# Patient Record
Sex: Male | Born: 1980 | Race: White | Hispanic: No | Marital: Single | Smoking: Former smoker
Health system: Southern US, Community
[De-identification: ages and names within clinical notes are randomized; demographics above are authoritative.]

## PROBLEM LIST (undated history)

## (undated) DIAGNOSIS — F419 Anxiety disorder, unspecified: Secondary | ICD-10-CM

## (undated) DIAGNOSIS — K219 Gastro-esophageal reflux disease without esophagitis: Secondary | ICD-10-CM

## (undated) DIAGNOSIS — T7840XA Allergy, unspecified, initial encounter: Secondary | ICD-10-CM

## (undated) DIAGNOSIS — M1990 Unspecified osteoarthritis, unspecified site: Secondary | ICD-10-CM

## (undated) DIAGNOSIS — E7800 Pure hypercholesterolemia, unspecified: Secondary | ICD-10-CM

## (undated) HISTORY — DX: Gastro-esophageal reflux disease without esophagitis: K21.9

## (undated) HISTORY — DX: Allergy, unspecified, initial encounter: T78.40XA

## (undated) HISTORY — PX: WRIST SURGERY: SHX841

## (undated) HISTORY — PX: ARTERIAL THROMBECTOMY: SHX558

## (undated) HISTORY — DX: Unspecified osteoarthritis, unspecified site: M19.90

## (undated) HISTORY — PX: WISDOM TOOTH EXTRACTION: SHX21

## (undated) HISTORY — DX: Anxiety disorder, unspecified: F41.9

---

## 1999-11-08 ENCOUNTER — Emergency Department (HOSPITAL_COMMUNITY): Admission: EM | Admit: 1999-11-08 | Discharge: 1999-11-08 | Payer: Self-pay | Admitting: Emergency Medicine

## 2004-08-02 ENCOUNTER — Emergency Department (HOSPITAL_COMMUNITY): Admission: EM | Admit: 2004-08-02 | Discharge: 2004-08-02 | Payer: Self-pay | Admitting: *Deleted

## 2009-04-10 ENCOUNTER — Encounter: Admission: RE | Admit: 2009-04-10 | Discharge: 2009-04-10 | Payer: Self-pay | Admitting: Family Medicine

## 2009-09-23 ENCOUNTER — Encounter: Admission: RE | Admit: 2009-09-23 | Discharge: 2009-09-23 | Payer: Self-pay | Admitting: Family Medicine

## 2009-09-23 ENCOUNTER — Telehealth (INDEPENDENT_AMBULATORY_CARE_PROVIDER_SITE_OTHER): Payer: Self-pay | Admitting: *Deleted

## 2009-09-24 ENCOUNTER — Ambulatory Visit: Payer: Self-pay | Admitting: Internal Medicine

## 2009-09-24 DIAGNOSIS — K648 Other hemorrhoids: Secondary | ICD-10-CM | POA: Insufficient documentation

## 2009-09-24 DIAGNOSIS — K219 Gastro-esophageal reflux disease without esophagitis: Secondary | ICD-10-CM | POA: Insufficient documentation

## 2009-09-24 DIAGNOSIS — K625 Hemorrhage of anus and rectum: Secondary | ICD-10-CM | POA: Insufficient documentation

## 2009-10-05 ENCOUNTER — Ambulatory Visit: Payer: Self-pay | Admitting: Internal Medicine

## 2009-10-06 ENCOUNTER — Encounter: Payer: Self-pay | Admitting: Internal Medicine

## 2010-02-11 NOTE — Letter (Signed)
Summary: Patient [facility] Biopsy Results  [facility]  [address] [place], [place] [address]   Phone: [phone]  Fax: [phone]        October 06, 2009 MRN: [id number]    BRONSEN SERANO [address] [place], [place]  [address]    Dear Mr. Schley,  I am pleased to inform you that the biopsies taken during your recent endoscopic examination did not show any evidence of cancer upon pathologic examination.The biopsies show mild inflammation due to acid reflux.  Additional information/recommendations:  __No further action is needed at this time.  Please follow-up with      your primary care physician for your other healthcare needs.  __ Please call [phone] to schedule a return visit to review      your condition.  _x_ Continue with the treatment plan as outlined on the day of your      exam.  __.   Please call us if you are having persistent problems or have questions about your condition that have not been fully answered at this time.  Sincerely,  Hart Carwin MD  This letter has been electronically signed by your physician.  Appended Document: Patient Notice-Endo Biopsy Results letter mailed

## 2010-02-11 NOTE — Assessment & Plan Note (Signed)
Summary: SEVERE GERD, N/V, RECTAL BLEEDING   (NEW TO GI)   Henry Collins   History of Present Illness Visit Type: Initial Visit Primary GI MD: Lina Sar MD Primary Provider: Lavada Mesi, MD Chief Complaint: Severe GERD, nausea, BRB rectum 8/14 History of Present Illness:   Henry Collins 30 YO MALE NEW TO G.I. TODAY. HE COMES IN WITH C/O PROGRESSIVE ACID REFLUX. HE SAYS HE HAS HAD SXS FOR YEARS WITH INTERMITTENT BAD EPISODES OF BURBING,BEKCHING AND SOUR BITTER ACID REFLUX. HIS SXS HAVE BECOME WORSE OVER THE PAST YEAR. HE QUIT SMOKING 2 YEARS AGO AND WAS BETTER FOR A WHILE THE SXS RECURRED. NOW HAVING EPISODES 2-3 X WEEKLY . HE IS USING HIS GIRLFRIENDS DEXILANT AS NEEDED-IT HELPS. HE C/O A BURNING FEELING WITH SWALLOWING FREQUENTLY, NO REAL DYSPHAGIA. +NIGHTIME SXS,DIFFICULTY SLEEPING.   HE ALSO HAD AN EPISODE OF BRB  WITH A BM YESTERDAY. HE SAYS HE HAS HAD A HEMORRHOID PREVIOUSLY,EXTERNAL AND A LITTLE BLEEDING. NOTED SOME RECTAL BURNING,ITCHING A COUPLE DAYS AGO,THEN THE ONE EPISODE OF BLOOD ON THE TISSUE ONLY YESTERDAY, NO BLOOD TODAY. BOWEL HABITS IRREGULAR LONG TERM. HE ADMITS TO BEING  "HIGH STRUNG", AND ALL OF HIS SXS SEEM TO BE AGGRAVATED BY STRESS. NO REGULAR ETOH, OR NSAIDS.   GI Review of Systems    Reports abdominal pain, acid reflux, belching, bloating, loss of appetite, and  nausea.      Denies chest pain, dysphagia with liquids, dysphagia with solids, heartburn, vomiting, vomiting blood, weight loss, and  weight gain.      Reports change in bowel habits, diarrhea, rectal bleeding, and  rectal pain.     Denies anal fissure, black tarry stools, constipation, diverticulosis, fecal incontinence, heme positive stool, hemorrhoids, irritable bowel syndrome, jaundice, light color stool, and  liver problems. Preventive Screening-Counseling & Management  Alcohol-Tobacco     Smoking Status: quit      Drug Use:  no.      Current Medications (verified): 1)  Dexilant 60 Mg Cpdr  (Dexlansoprazole) .... As Needed  Allergies (verified): 1)  ! * Clorox 2)  ! 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  Alcohol Use - yes Daily Caffeine Use Illicit Drug Use - no Smoking Status:  quit Drug Use:  no  Review of Systems       The patient complains of back pain, headaches-new, shortness of breath, and sleeping problems.  The patient denies allergy/sinus, anemia, anxiety-new, arthritis/joint pain, blood in urine, breast changes/lumps, change in vision, confusion, cough, coughing up blood, depression-new, fainting, fatigue, fever, hearing problems, heart murmur, heart rhythm changes, itching, menstrual pain, muscle pains/cramps, night sweats, nosebleeds, pregnancy symptoms, skin rash, sore throat, swelling of feet/legs, swollen lymph glands, thirst - excessive , urination - excessive , urination changes/pain, urine leakage, vision changes, and voice change.         SEE HPI  Vital Signs:  Patient profile:   30 year old male Height:      73 inches Weight:      193.25 pounds BMI:     25.59 Pulse rate:   64 / minute Pulse rhythm:   regular BP sitting:   114 / 82  (left arm) Cuff size:   regular  Vitals Entered By: June McMurray CMA Duncan Dull) (September 24, 2009 2:04 PM)  Physical Exam  General:  Well developed, well nourished, no acute distress. Head:  Normocephalic  and atraumatic. Eyes:  PERRLA, no icterus. Neck:  Supple; no masses or thyromegaly. Lungs:  Clear throughout to auscultation. Heart:  Regular rate and rhythm; no murmurs, rubs,  or bruits. Abdomen:  SOFT, NONTENDER, NO MASS OR HSM,BS+ Rectal:  SMALL HEMORRHOID AT ANAL VERGE,INFLAMED NOT THROMBOSED,STOOL TRACE POSITIVE Extremities:  No clubbing, cyanosis, edema or deformities noted. Neurologic:  Alert and  oriented x4;  grossly normal neurologically. Psych:  Alert and  cooperative. Normal mood and affect.anxious.     Impression & Recommendations:  Problem # 1:  GERD (ICD-530.81) Assessment Deteriorated 30  YO MALE  WITH CHRONIC GERD,INCREASED SXS X SEVERAL MONTHS,SUSPECT ESOPHAGITIS CURRENTLY.  ANTI-REFLUX REGIMEN START TRIAL OF NEXIUM 40 MG TWICE DAILY X 2 WEEKS, THEN QAM DAILY SCHEDULE FOR EGD WITH DR. Hermelinda Medicus DISCUSSED IN DETAIL WITH PT, AND HE IS AGREEABLE. Orders: EGD (EGD)  Problem # 2:  HEMORRHOIDS, INTERNAL (ICD-455.0) Assessment: New SINGLE EPISODE OF BRB  WITH BM,ON TISSUE- SECONDARY TO SMALL HEMORRHOID.  ANUSOL HC SUPP AT BEDTIME X 7-10 DAYS THEN AS NEEDED. Orders: EGD (EGD)  Patient Instructions: 1)  We scheduled the Endoscopy with Dr Juanda Chance for 10-05-09. 2)  Directions and brochure provided. 3)  [facility] Patient Information Guide given to patient. 4)  We sent a prescription for Nexium 40 mg. Take 1 tab twice daily for 2 weeks ( 30 min prior to breakfast and dinner_)  then go to once daily.  5)  Samples provided. 6)  Copy sent to : Lavada Mesi, MD 7)  The medication list was reviewed and reconciled.  All changed / newly prescribed medications were explained.  A complete medication list was provided to the patient / caregiver. Prescriptions: NEXIUM 40 MG CPDR (ESOMEPRAZOLE MAGNESIUM) Take 1 capsule 30 in prior to breakfast  #30 x 3   Entered by:   Lowry Ram NCMA   Authorized by:   Sammuel Cooper PA-c   Signed by:   Lowry Ram NCMA on 09/24/2009   Method used:   [facility]  [address] [phone][facility]       [address]       [place], [place]  [id number]       Ph: [phone] or [phone]       Fax: [phone]   RxID:   [id number]

## 2010-02-11 NOTE — Letter (Signed)
Summary: EGD Instructions  [facility]  [address] [place], [place] [address]   Phone: [phone]  Fax: [phone]       Henry Collins    25-Apr-1980    MRN: [id number]       Procedure Day /Date: 10-05-09     Arrival Time: 3:00 PM      Procedure Time: 4:00 PM     Location of Procedure:                    X     [facility] (4th Floor)  PREPARATION FOR ENDOSCOPY   On 10-05-09 THE DAY OF THE PROCEDURE: MONDAY  1.   No solid foods, milk or milk products are allowed after midnight the night before your procedure.  2.   Do not drink anything colored red or purple.  Avoid juices with pulp.  No orange juice.  3.  You may drink clear liquids until 2:00 PM , which is 2 hours before your procedure.                                                                                                CLEAR LIQUIDS INCLUDE: Water Jello Ice Popsicles Tea (sugar ok, no milk/cream) Powdered fruit flavored drinks Coffee (sugar ok, no milk/cream) Gatorade Juice: apple, white grape, white cranberry  Lemonade Clear bullion, consomm, broth Carbonated beverages (any kind) Strained chicken noodle soup Hard Candy   MEDICATION INSTRUCTIONS  Unless otherwise instructed, you should take regular prescription medications with a small sip of water as early as possible the morning of your procedure.          OTHER INSTRUCTIONS  You will need a responsible adult at least 32 years of age to accompany you and drive you home.   This person must remain in the waiting room during your procedure.  Wear loose fitting clothing that is easily removed.  Leave jewelry and other valuables at home.  However, you may wish to bring a book to read or an iPod/MP3 player to listen to music as you wait for your procedure to start.  Remove all body piercing jewelry and leave at home.  Total time from sign-in until discharge is approximately 2-3 hours.  You should go home directly  after your procedure and rest.  You can resume normal activities the day after your procedure.  The day of your procedure you should not:   Drive   Make legal decisions   Operate machinery   Drink alcohol   Return to work  You will receive specific instructions about eating, activities and medications before you leave.    The above instructions have been reviewed and explained to me by   _______________________    I fully understand and can verbalize these instructions _____________________________ Date _________

## 2010-02-11 NOTE — Miscellaneous (Signed)
Summary: Nexium RX  Clinical Lists Changes  Medications: Added new medication of NEXIUM 40 MG  CPDR (ESOMEPRAZOLE MAGNESIUM) 1 capsule twice a day 30 minutes before meals - Signed Rx of NEXIUM 40 MG  CPDR (ESOMEPRAZOLE MAGNESIUM) 1 capsule twice a day 30 minutes before meals;  #60 x 3;  Signed;  Entered by: Durwin Glaze RN;  Authorized by: Hart Carwin MD;  Method used: [facility]  [address] [id number] [address] [place], [place], [place]  [address], Ph: [phone] or [phone], Fax: [phone]    Prescriptions: NEXIUM 40 MG  CPDR (ESOMEPRAZOLE MAGNESIUM) 1 capsule twice a day 30 minutes before meals  #60 x 3   Entered by:   Durwin Glaze RN   Authorized by:   Hart Carwin MD   Signed by:   Durwin Glaze RN on 10/05/2009   Method used:   [facility]        [facility]  [address] [id number]       [address]       [place], [place]  [address]       Ph: [phone] or [phone]       Fax: [phone]   RxID:   [id number]

## 2010-02-11 NOTE — Progress Notes (Signed)
Summary: TRIAGE  Phone Note Call from Patient Call back at Home Phone [phone] Call back at Work Phone [phone]   Caller: Blain Pais Call For: DOD Reason for Call: Talk to Nurse Summary of Call: Steffanie Rainwater would like patient seen asap for severe rectal bleeding and heartburn x couple weeks. Initial call taken by: Tawni Levy,  September 23, 2009 11:41 AM  Follow-up for Phone Call        Call placed to [place] at [facility] re: appt. for Artyom and she is on her lunchtime so I asked them to have her call back. Follow-up by: Teryl Lucy RN,  September 23, 2009 12:05 PM  Additional Follow-up for Phone Call Additional follow up Details #1::        Per pt. girlfriend, pt. c/o intermittent vomiting w/occ. blood X 1-2 monthes, and severe indigestion that is getting worse. Also has rectal bleeding X2 monthes, "Off and on" Would like pt. seen ASAP.  Pt. will see Mike Gip PAC on 09-24-09 at 2pm. Morrie Sheldon advised of med.list/co-pay/cx.policy.  If symptoms become worse call back immediately or go to ER.  Additional Follow-up by: Laureen Ochs LPN,  September 23, 2009 12:23 PM

## 2010-02-11 NOTE — Procedures (Signed)
Summary: Upper Endoscopy  Patient: Henry Collins Note: All result statuses are Final unless otherwise noted.  Tests: (1) Upper Endoscopy (EGD)   EGD Upper Endoscopy       DONE (C)     [facility]     [address]     [place], [place]  [address]           ENDOSCOPY PROCEDURE REPORT           PATIENT:  Henry Collins, Henry Collins  MR#:  [id number]     BIRTHDATE:  [birth year], 29 yrs. old  GENDER:  male           ENDOSCOPIST:  Hedwig Morton. Juanda Chance, MD     Referred by:  Lavada Mesi, M.D.           PROCEDURE DATE:  10/05/2009     PROCEDURE:  EGD with biopsy     ASA CLASS:  Class I     INDICATIONS:  heartburn, GERD relieved with Nexiem 40 mg qd           MEDICATIONS:   Versed 9 mg, Fentanyl 75 mcg     TOPICAL ANESTHETIC:  Exactacain Spray           DESCRIPTION OF PROCEDURE:   After the risks benefits and     alternatives of the procedure were thoroughly explained, informed     consent was obtained.  The  endoscope was introduced through the     mouth and advanced to the second portion of the duodenum, without     limitations.  The instrument was slowly withdrawn as the mucosa     was fully examined.     <<PROCEDUREIMAGES>>           The upper, middle, and distal third of the esophagus were     carefully inspected and no abnormalities were noted. The z-line     was well seen at the GEJ. The endoscope was pushed into the fundus     which was normal including a retroflexed view. The antrum,gastric     body, first and second part of the duodenum were unremarkable.     With standard forceps, a biopsy was obtained and sent to pathology     (see image1, image2, image3, image4, image5, and image6). Bx from     g-e junction, no stricture, normal appearing z-line    Retroflexed     views revealed no abnormalities.    The scope was then withdrawn     from the patient and the procedure completed.           COMPLICATIONS:  None           ENDOSCOPIC IMPRESSION:     1) Normal EGD     s/p  biopsy from g-e junction     RECOMMENDATIONS:     1) Anti-reflux regimen to be follow     continue Nexiem 40 mg twice a day #60, 3 refills           REPEAT EXAM:  In 0 year(s) for.           ______________________________     Hedwig Morton. Juanda Chance, MD           CC:           n.     REVISED:  10/05/2009 11:26 AM     eSIGNED:   Hedwig Morton. Brodie at 10/05/2009 11:26 AM  Rael, Yo, [id number]  Note: An exclamation mark (!) indicates a result that was not dispersed into the flowsheet. Document Creation Date: 10/05/2009 11:27 AM _______________________________________________________________________  (1) Order result status: Final Collection or observation date-time: 10/05/2009 11:08 Requested date-time:  Receipt date-time:  Reported date-time:  Referring Physician:   Ordering Physician: Lina Sar [id number]) Specimen Source:  Source: Launa Grill Order Number: [id number] Lab site:

## 2010-05-28 NOTE — Op Note (Signed)
NAMEDYLLEN, MENNING NO.:  [id number]   MEDICAL RECORD NO.:  [id number]          PATIENT TYPE:  EMS   LOCATION:  MAJO                         FACILITY:  [facility]   PHYSICIAN:  Nadara Mustard, MD     DATE OF BIRTH:  1980/02/12   DATE OF PROCEDURE:  DATE OF DISCHARGE:                                 OPERATIVE REPORT   PREOPERATIVE DIAGNOSIS:  Right radial artery laceration.   POSTOPERATIVE DIAGNOSES:  1.  Right radial artery laceration.  2.  FCR laceration.  3.  6 cm laceration of the wrist.   PROCEDURE:  1.  Right radial artery repair.  2.  Right FCR repair.   SURGEON.:  Nadara Mustard, MD   ANESTHESIA:  General.   ESTIMATED BLOOD LOSS:  Minimal.   ANTIBIOTICS:  1 gram of Kefzol.   TOURNIQUET TIME:  31 minutes with the tourniquet at the arm.   DISPOSITION:  To PACU in stable condition. Plan for discharge to home. Plan  for ice and elevation and follow-up in the office in 4 days.   INDICATIONS FOR PROCEDURE:  The patient is a 30 year old gentleman who was  doing some tiling work when he fell on a shard of tile onto his outstretched  right arm, sustaining a laceration at the wrist over the radial aspect of  the volar wrist. The patient presented to the emergency room. He is right-  hand dominant with pulsatile bleeding from the radial artery.  He had a  tourniquet applied, compression wrap applied and the tourniquet was  sequentially released to allow for blood flow and the patient was seen in  consultation.   EVALUATION:  The patient had a gross a range of motion of his fingers and  had intact sensation in all digits. His flexion of his wrist was not checked  for strength. His hand had good capillary refill.  Radiographs were not  obtained.   ASSESSMENT:  Laceration right wrist with radial artery laceration, pulsatile  bleeding and possible tendon laceration.   PLAN:  The patient was scheduled for emergent surgical intervention. Risks  and  benefits were discussed including infection, neurovascular injury,  clotting of the repair, rupture of the tendon repair, need for additional  surgery. The patient states he understands and wished to proceed at this  time.   DESCRIPTION OF PROCEDURE:  The patient was brought to OR room 15 and  underwent general anesthetic. After adequate level of anesthesia obtained, a  tourniquet that was placed at the arm and his right upper extremity was  prepped using Betadine paint and draped into a sterile field. The traumatic  laceration of 4 cm extended proximally an additional 2 cm. Examination  showed a laceration of the FCR and this was then irrigated and repaired  using a 3-0 FiberWire suture. This was repaired in a Kessler fashion. The  wrist was flexed to allow to decrease tension on the repair while it was  being repaired. Further dissection was then performed at the radial artery.  There was no laceration within the surgical area.  Exploration proximally  showed  a 50% laceration of the radial artery just proximal to the traumatic  laceration. Using micro instruments, the fascia was debrided and the wound  edges freshened. This was repaired using interrupted suture with six nylon.  The vessel clamps were released and the tourniquet was released. There was  no bleeding. There was good pulsatile flow through the repair. The skin was  then closed with a 6 cm traumatic laceration was closed using 3-0 nylon. The  wound was covered Adaptic orthopedic sponges, Webril and a Coban dressing.  The patient was extubated, taken to PACU in stable condition. Plan for  discharge to home. He will take an aspirin 325 milligrams p.o. q.d. for 4  weeks. Prescription for Vicodin and Keflex. He is given instructions for  elevation and nonuse of the hand.  Plan to follow up in the office in four  days.       MVD/MEDQ  D:  08/03/2004  T:  08/03/2004  Job:  [id number]

## 2010-08-28 ENCOUNTER — Other Ambulatory Visit: Payer: Self-pay | Admitting: Physician Assistant

## 2011-02-02 ENCOUNTER — Other Ambulatory Visit: Payer: Self-pay | Admitting: Physician Assistant

## 2011-02-28 ENCOUNTER — Other Ambulatory Visit: Payer: Self-pay | Admitting: Physician Assistant

## 2011-03-01 NOTE — Telephone Encounter (Signed)
#   30 with no refills.  Call office and ask for Dr. Delia Chimes nurse for refills.

## 2011-08-08 ENCOUNTER — Other Ambulatory Visit: Payer: Self-pay | Admitting: Physician Assistant

## 2012-02-22 ENCOUNTER — Ambulatory Visit (INDEPENDENT_AMBULATORY_CARE_PROVIDER_SITE_OTHER): Payer: BC Managed Care – PPO | Admitting: Physician Assistant

## 2012-02-22 VITALS — BP 124/75 | HR 62 | Temp 97.8°F | Resp 16 | Ht 72.0 in | Wt 195.0 lb

## 2012-02-22 DIAGNOSIS — J069 Acute upper respiratory infection, unspecified: Secondary | ICD-10-CM

## 2012-02-22 MED ORDER — MUCINEX DM MAXIMUM STRENGTH 60-1200 MG PO TB12: 1.0000 | ORAL_TABLET | Freq: Two times a day (BID) | ORAL | Status: DC

## 2012-02-22 MED ORDER — IPRATROPIUM BROMIDE 0.03 % NA SOLN: 2.0000 | Freq: Two times a day (BID) | NASAL | Status: DC

## 2012-02-22 MED ORDER — BENZONATATE 100 MG PO CAPS: 100.0000 mg | ORAL_CAPSULE | Freq: Three times a day (TID) | ORAL | Status: DC | PRN

## 2012-02-22 MED ORDER — HYDROXYZINE HCL 25 MG PO TABS: 12.5000 mg | ORAL_TABLET | Freq: Three times a day (TID) | ORAL | Status: DC | PRN

## 2012-02-22 NOTE — Progress Notes (Signed)
  Subjective:    Patient ID: Henry Collins, male    DOB: 04/08/1980, 32 y.o.   MRN: [id number]  HPI   Henry Collins is a 32 yr old male here with 5 days of URI symptoms.  Began Saturday with nasal congestion, runny nose, watery eyes.  Ears are popping, some sore throat.  A little bit of a cough has developed today, "feels like my chest is on fire".  Denies fever, chills, GI symptoms, body aches, muscle aches.  Was given azithromycin three days ago by his PCP.  Has one more dose of this tomorrow.  Has been using Tylenol sinus and Afrin for symptoms.     Review of Systems  Constitutional: Negative for fever and chills.  HENT: Positive for ear pain, congestion, rhinorrhea and sinus pressure. Negative for sore throat.   Respiratory: Negative for cough, shortness of breath and wheezing.   Cardiovascular: Negative.   Gastrointestinal: Negative.   Musculoskeletal: Negative.   Neurological: Positive for headaches.       Objective:   Physical Exam  Vitals reviewed. Constitutional: He is oriented to person, place, and time. He appears well-developed and well-nourished. No distress.  HENT:  Head: Normocephalic and atraumatic.  Right Ear: Ear canal normal. Tympanic membrane is erythematous.  Left Ear: Tympanic membrane and ear canal normal.  Nose: Mucosal edema and rhinorrhea present. Right sinus exhibits no maxillary sinus tenderness and no frontal sinus tenderness. Left sinus exhibits no maxillary sinus tenderness and no frontal sinus tenderness.  Mouth/Throat: Uvula is midline, oropharynx is clear and moist and mucous membranes are normal.  Eyes: Conjunctivae are normal. No scleral icterus.  Neck: Neck supple.  Cardiovascular: Normal rate, regular rhythm and normal heart sounds.  Exam reveals no gallop and no friction rub.   No murmur heard. Pulmonary/Chest: Effort normal and breath sounds normal. He has no decreased breath sounds. He has no wheezes. He has no rhonchi. He has no rales.   Lymphadenopathy:    He has no cervical adenopathy.  Neurological: He is alert and oriented to person, place, and time.  Skin: Skin is warm and dry.  Psychiatric: He has a normal mood and affect.     Filed Vitals:   02/22/12 0924  BP: 124/75  Pulse: 62  Temp: 97.8 F (36.6 C)  Resp: 16        Assessment & Plan:   1. Acute upper respiratory infections of unspecified site  benzonatate (TESSALON) 100 MG capsule      Dextromethorphan-Guaifenesin (MUCINEX DM MAXIMUM STRENGTH) 60-1200 MG TB12   ipratropium (ATROVENT) 0.03 % nasal spray       Henry Collins is a 32 yr old male here with URI.  He is already taking azithromycin.  Encouraged him to finish this as it will cover for bacterial infection, though this is likely viral.  Will treat symptoms with Tessalon, Atrovent, and Mucinex DM.  Also encouraged him to start an otc antihistamine.  Plenty of fluids and rest.  Discussed RTC precautions.  Pt understands and is in agreement.

## 2012-02-22 NOTE — Patient Instructions (Addendum)
Finish the azithromycin that you were given by your primary doctor.  This medicine stays in your body for 1-2 weeks after your last dose.  It will cover for any bacterial infection.  STOP using Afrin.  Start using Atrovent nasal spray instead - this is used twice daily to help with nasal congestion, ear pressure, and post-nasal drainage.  Begin taking the Mucinex DM twice daily  - this will help with the cough, and help clear out congestion.  You can use Tessalon Perles three times daily for cough if needed.  Plenty of fluids (water is best!) and rest.  Please let ut know if you feel like you are worsening or not improving.      Upper Respiratory Infection, Adult An upper respiratory infection (URI) is also sometimes known as the common cold. The upper respiratory tract includes the nose, sinuses, throat, trachea, and bronchi. Bronchi are the airways leading to the lungs. Most people improve within 1 week, but symptoms can last up to 2 weeks. A residual cough may last even longer.  CAUSES Many different viruses can infect the tissues lining the upper respiratory tract. The tissues become irritated and inflamed and often become very moist. Mucus production is also common. A cold is contagious. You can easily spread the virus to others by oral contact. This includes kissing, sharing a glass, coughing, or sneezing. Touching your mouth or nose and then touching a surface, which is then touched by another person, can also spread the virus. SYMPTOMS  Symptoms typically develop 1 to 3 days after you come in contact with a cold virus. Symptoms vary from person to person. They may include:  Runny nose.  Sneezing.  Nasal congestion.  Sinus irritation.  Sore throat.  Loss of voice (laryngitis).  Cough.  Fatigue.  Muscle aches.  Loss of appetite.  Headache.  Low-grade fever. DIAGNOSIS  You might diagnose your own cold based on familiar symptoms, since most people get a cold 2 to 3 times a  year. Your caregiver can confirm this based on your exam. Most importantly, your caregiver can check that your symptoms are not due to another disease such as strep throat, sinusitis, pneumonia, asthma, or epiglottitis. Blood tests, throat tests, and X-rays are not necessary to diagnose a common cold, but they may sometimes be helpful in excluding other more serious diseases. Your caregiver will decide if any further tests are required. RISKS AND COMPLICATIONS  You may be at risk for a more severe case of the common cold if you smoke cigarettes, have chronic heart disease (such as heart failure) or lung disease (such as asthma), or if you have a weakened immune system. The very young and very old are also at risk for more serious infections. Bacterial sinusitis, middle ear infections, and bacterial pneumonia can complicate the common cold. The common cold can worsen asthma and chronic obstructive pulmonary disease (COPD). Sometimes, these complications can require emergency medical care and may be life-threatening. PREVENTION  The best way to protect against getting a cold is to practice good hygiene. Avoid oral or hand contact with people with cold symptoms. Wash your hands often if contact occurs. There is no clear evidence that vitamin C, vitamin E, echinacea, or exercise reduces the chance of developing a cold. However, it is always recommended to get plenty of rest and practice good nutrition. TREATMENT  Treatment is directed at relieving symptoms. There is no cure. Antibiotics are not effective, because the infection is caused by a virus, not  by bacteria. Treatment may include:  Increased fluid intake. Sports drinks offer valuable electrolytes, sugars, and fluids.  Breathing heated mist or steam (vaporizer or shower).  Eating chicken soup or other clear broths, and maintaining good nutrition.  Getting plenty of rest.  Using gargles or lozenges for comfort.  Controlling fevers with ibuprofen  or acetaminophen as directed by your caregiver.  Increasing usage of your inhaler if you have asthma. Zinc gel and zinc lozenges, taken in the first 24 hours of the common cold, can shorten the duration and lessen the severity of symptoms. Pain medicines may help with fever, muscle aches, and throat pain. A variety of non-prescription medicines are available to treat congestion and runny nose. Your caregiver can make recommendations and may suggest nasal or lung inhalers for other symptoms.  HOME CARE INSTRUCTIONS   Only take over-the-counter or prescription medicines for pain, discomfort, or fever as directed by your caregiver.  Use a warm mist humidifier or inhale steam from a shower to increase air moisture. This may keep secretions moist and make it easier to breathe.  Drink enough water and fluids to keep your urine clear or pale yellow.  Rest as needed.  Return to work when your temperature has returned to normal or as your caregiver advises. You may need to stay home longer to avoid infecting others. You can also use a face mask and careful hand washing to prevent spread of the virus. SEEK MEDICAL CARE IF:   After the first few days, you feel you are getting worse rather than better.  You need your caregiver's advice about medicines to control symptoms.  You develop chills, worsening shortness of breath, or brown or red sputum. These may be signs of pneumonia.  You develop yellow or brown nasal discharge or pain in the face, especially when you bend forward. These may be signs of sinusitis.  You develop a fever, swollen neck glands, pain with swallowing, or white areas in the back of your throat. These may be signs of strep throat. SEEK IMMEDIATE MEDICAL CARE IF:   You have a fever.  You develop severe or persistent headache, ear pain, sinus pain, or chest pain.  You develop wheezing, a prolonged cough, cough up blood, or have a change in your usual mucus (if you have chronic  lung disease).  You develop sore muscles or a stiff neck. Document Released: 06/22/2000 Document Revised: 03/21/2011 Document Reviewed: 04/30/2010 [facility] Patient Information 2013 [place], [place].

## 2012-06-30 ENCOUNTER — Ambulatory Visit (INDEPENDENT_AMBULATORY_CARE_PROVIDER_SITE_OTHER): Payer: BC Managed Care – PPO | Admitting: Emergency Medicine

## 2012-06-30 VITALS — BP 130/90 | HR 98 | Temp 97.8°F | Resp 18 | Ht 71.0 in | Wt 179.4 lb

## 2012-06-30 DIAGNOSIS — R000 Tachycardia, unspecified: Secondary | ICD-10-CM

## 2012-06-30 DIAGNOSIS — F411 Generalized anxiety disorder: Secondary | ICD-10-CM

## 2012-06-30 LAB — COMPREHENSIVE METABOLIC PANEL
ALT: 41 U/L (ref 0–53)
Alkaline Phosphatase: 70 U/L (ref 39–117)
Creat: 1.15 mg/dL (ref 0.50–1.35)
Potassium: 4.9 mEq/L (ref 3.5–5.3)
Sodium: 139 mEq/L (ref 135–145)
Total Bilirubin: 0.8 mg/dL (ref 0.3–1.2)
Total Protein: 7.3 g/dL (ref 6.0–8.3)

## 2012-06-30 LAB — POCT CBC
Granulocyte percent: 57.9 %G (ref 37–80)
HCT, POC: 46.7 % (ref 43.5–53.7)
MCV: 88.5 fL (ref 80–97)
MID (cbc): 0.4 (ref 0–0.9)
POC Granulocyte: 3.2 (ref 2–6.9)
RBC: 5.28 M/uL (ref 4.69–6.13)

## 2012-06-30 LAB — LIPID PANEL
HDL: 34 mg/dL — ABNORMAL LOW (ref >39)
LDL Cholesterol: 186 mg/dL — ABNORMAL HIGH (ref 0–99)
Total CHOL/HDL Ratio: 7.3 Ratio
Triglycerides: 143 mg/dL (ref <150)
VLDL: 29 mg/dL (ref 0–40)

## 2012-06-30 MED ORDER — LORAZEPAM 1 MG PO TABS: 1.0000 mg | ORAL_TABLET | Freq: Three times a day (TID) | ORAL | Status: DC | PRN

## 2012-06-30 MED ORDER — PAROXETINE HCL 20 MG PO TABS: 20.0000 mg | ORAL_TABLET | ORAL | Status: DC

## 2012-06-30 NOTE — Progress Notes (Signed)
[facility] [address], [place] [address] [phone]  Date:  06/30/2012   Name:  Henry Collins   DOB:  [birth year]   MRN:  [id number]  PCP:  No primary provider on file.    Chief Complaint: Shaking and Shortness of Breath   History of Present Illness:  Henry Collins is a 32 y.o. 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  Says yesterday he had "shaking" in his right arm.  Feels like he cannot get a full, satisfying breath.  No wheezing,cough, or sputum production.  No chest pain.  Slept normally last night and now feels "shaky" in his whole body.  No history of seizures. Non smoker.  No medications. Has lost 30 pounds in last 2 months.  Not eating regular meals.  Has one year old son and is no longer with the mother.  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  Denies excess alcohol, caffeine, illicit drugs, or other issues  No improvement with over the counter medications or other home remedies. Denies other complaint or health concern today.   Patient Active Problem List   Diagnosis Date Noted  . HEMORRHOIDS, INTERNAL 09/24/2009  . GERD 09/24/2009  . RECTAL BLEEDING 09/24/2009    Past Medical History  Diagnosis Date  . Allergy   . Arthritis   . Anxiety   . Depression   . GERD (gastroesophageal reflux disease)     History reviewed. No pertinent past surgical history.  History  Substance Use Topics  . Smoking status: Former Smoker    Quit date: 06/30/2008  . Smokeless tobacco: Not on file  . Alcohol Use: No    History reviewed. No pertinent family history.  No Known Allergies  Medication list has been reviewed and updated.  Current Outpatient Prescriptions on File Prior to Visit  Medication Sig Dispense Refill  . NEXIUM 40 MG capsule TAKE ONE CAPSULE BY MOUTH 30 MINUTES PRIOR TO BREAKFAST  30 capsule  3  .  azithromycin (ZITHROMAX) 200 MG/5ML suspension Take 1.25 mg/kg by mouth daily.      . benzonatate (TESSALON) 100 MG capsule Take 1-2 capsules (100-200 mg total) by mouth 3 (three) times daily as needed for cough.  40 capsule  0  . Dextromethorphan-Guaifenesin (MUCINEX DM MAXIMUM STRENGTH) 60-1200 MG TB12 Take 1 tablet by mouth every 12 (twelve) hours.  30 each  0  . ipratropium (ATROVENT) 0.03 % nasal spray Place 2 sprays into the nose 2 (two) times daily.  30 mL  1   No current facility-administered medications on file prior to visit.    Review of Systems:  As per HPI, otherwise negative. \   Physical Examination: Filed Vitals:   06/30/12 1105  BP: 130/90  Pulse: 98  Temp: 97.8 F (36.6 C)  Resp: 18   Filed Vitals:   06/30/12 1105  Height: 5\' 11"  (1.803 m)  Weight: 179 lb 6.4 oz (81.375 kg)   Body mass index is 25.03 kg/(m^2). Ideal Body Weight: Weight in (lb) to have BMI = 25: 178.9  GEN: WDWN, NAD, Non-toxic, A & O x 3 HEENT: Atraumatic, Normocephalic. Neck supple. No masses, No LAD. Ears and Nose: No external deformity. CV: RRR, No M/G/R. No JVD. No thrill. No extra heart sounds. PULM: CTA B, no wheezes, crackles, rhonchi. No retractions. No resp. distress. No accessory muscle use. ABD: S, NT, ND, +  BS. No rebound. No HSM. EXTR: No c/c/e NEURO Normal gait.  PSYCH: Normally interactive. Conversant. Not depressed or anxious appearing.  Calm demeanor.    Assessment and Plan: Anxiety reaction Ativan paxil  Signed,  Phillips Odor, MD

## 2012-07-01 MED ORDER — ATORVASTATIN CALCIUM 20 MG PO TABS: 20.0000 mg | ORAL_TABLET | Freq: Every day | ORAL | Status: DC

## 2012-07-01 NOTE — Addendum Note (Signed)
Addended by: Carmelina Dane on: 07/01/2012 08:48 AM   Modules accepted: Orders

## 2012-08-23 ENCOUNTER — Other Ambulatory Visit: Payer: Self-pay | Admitting: Family Medicine

## 2012-08-23 DIAGNOSIS — M549 Dorsalgia, unspecified: Secondary | ICD-10-CM

## 2012-08-29 ENCOUNTER — Ambulatory Visit
Admission: RE | Admit: 2012-08-29 | Discharge: 2012-08-29 | Disposition: A | Discharge status: Home / Self Care | Payer: BC Managed Care – PPO | Source: Ambulatory Visit | Attending: Family Medicine | Admitting: Family Medicine

## 2012-08-29 DIAGNOSIS — M549 Dorsalgia, unspecified: Secondary | ICD-10-CM

## 2013-10-25 ENCOUNTER — Encounter: Payer: Self-pay | Admitting: Internal Medicine

## 2014-05-06 ENCOUNTER — Ambulatory Visit (INDEPENDENT_AMBULATORY_CARE_PROVIDER_SITE_OTHER): Payer: BLUE CROSS/BLUE SHIELD | Admitting: Emergency Medicine

## 2014-05-06 VITALS — BP 122/80 | HR 87 | Temp 98.0°F | Resp 18 | Ht 72.0 in | Wt 198.0 lb

## 2014-05-06 DIAGNOSIS — J209 Acute bronchitis, unspecified: Secondary | ICD-10-CM

## 2014-05-06 DIAGNOSIS — F411 Generalized anxiety disorder: Secondary | ICD-10-CM

## 2014-05-06 DIAGNOSIS — J0140 Acute pansinusitis, unspecified: Secondary | ICD-10-CM | POA: Diagnosis not present

## 2014-05-06 MED ORDER — HYDROCOD POLST-CPM POLST ER 10-8 MG/5ML PO SUER: 5.0000 mL | Freq: Two times a day (BID) | ORAL | Status: DC

## 2014-05-06 MED ORDER — PAROXETINE HCL 20 MG PO TABS: 20.0000 mg | ORAL_TABLET | ORAL | Status: DC

## 2014-05-06 MED ORDER — PSEUDOEPHEDRINE-GUAIFENESIN ER 60-600 MG PO TB12: 1.0000 | ORAL_TABLET | Freq: Two times a day (BID) | ORAL | Status: AC

## 2014-05-06 MED ORDER — AMOXICILLIN-POT CLAVULANATE 875-125 MG PO TABS: 1.0000 | ORAL_TABLET | Freq: Two times a day (BID) | ORAL | Status: DC

## 2014-05-06 NOTE — Progress Notes (Signed)
[facility] [address], [place] [id number] [phone]  Date:  05/06/2014   Name:  Henry Collins   DOB:  1980/01/22   MRN:  [id number]  PCP:  No primary care provider on file.    Chief Complaint: sinus pressure; Nasal Congestion; Otalgia; Cough; and Sore Throat   History of Present Illness:  Henry Collins is a [age] y.o. very pleasant male patient who presents with the following:  Ill with nasal congestion and post nasal drainage Has cough that is not productive.  No wheezing or shortness of breath Has purulent expectorated post nasal draiange No fever or chills Watery nasal discharge. No nausea or vomiting.  No stool change No rash No improvement with zpak or flonase No improvement with over the counter medications or other home remedies.  Denies other complaint or health concern today.   Patient Active Problem List   Diagnosis Date Noted  . HEMORRHOIDS, INTERNAL 09/24/2009  . GERD 09/24/2009  . RECTAL BLEEDING 09/24/2009    Past Medical History  Diagnosis Date  . Allergy   . Arthritis   . Anxiety   . Depression   . GERD (gastroesophageal reflux disease)     History reviewed. No pertinent past surgical history.  History  Substance Use Topics  . Smoking status: Former Smoker    Quit date: 06/30/2008  . Smokeless tobacco: Not on file  . Alcohol Use: No    History reviewed. No pertinent family history.  No Known Allergies  Medication list has been reviewed and updated.  Current Outpatient Prescriptions on File Prior to Visit  Medication Sig Dispense Refill  . azithromycin (ZITHROMAX) 200 MG/5ML suspension Take 1.25 mg/kg by mouth daily.    Marland Kitchen. NEXIUM 40 MG capsule TAKE ONE CAPSULE BY MOUTH 30 MINUTES PRIOR TO BREAKFAST 30 capsule 3  . atorvastatin (LIPITOR) 20 MG tablet Take 1 tablet (20 mg total) by mouth daily. (Patient not taking: Reported on 05/06/2014) 90 tablet 1  . benzonatate (TESSALON) 100 MG capsule Take 1-2 capsules  (100-200 mg total) by mouth 3 (three) times daily as needed for cough. (Patient not taking: Reported on 05/06/2014) 40 capsule 0  . Dextromethorphan-Guaifenesin (MUCINEX DM MAXIMUM STRENGTH) 60-1200 MG TB12 Take 1 tablet by mouth every 12 (twelve) hours. (Patient not taking: Reported on 05/06/2014) 30 each 0  . ipratropium (ATROVENT) 0.03 % nasal spray Place 2 sprays into the nose 2 (two) times daily. (Patient not taking: Reported on 05/06/2014) 30 mL 1  . LORazepam (ATIVAN) 1 MG tablet Take 1 tablet (1 mg total) by mouth every 8 (eight) hours as needed for anxiety. (Patient not taking: Reported on 05/06/2014) 45 tablet 0  . PARoxetine (PAXIL) 20 MG tablet Take 1 tablet (20 mg total) by mouth every morning. (Patient not taking: Reported on 05/06/2014) 30 tablet 5   No current facility-administered medications on file prior to visit.    Review of Systems:  As per HPI, otherwise negative.    Physical Examination: Filed Vitals:   05/06/14 1401  BP: 122/80  Pulse: 87  Temp: 98 F (36.7 C)  Resp: 18   Filed Vitals:   05/06/14 1401  Height: 6' (1.829 m)  Weight: 198 lb (89.812 kg)   Body mass index is 26.85 kg/(m^2). Ideal Body Weight: Weight in (lb) to have BMI = 25: 183.9  GEN: WDWN, NAD, Non-toxic, A & O x 3  Prominent cough HEENT: Atraumatic, Normocephalic. Neck supple. No masses, No LAD. Ears and Nose: No  external deformity.  Left serous otitis media CV: RRR, No M/G/R. No JVD. No thrill. No extra heart sounds. PULM: CTA B, no wheezes, crackles, rhonchi. No retractions. No resp. distress. No accessory muscle use. ABD: S, NT, ND, +BS. No rebound. No HSM. EXTR: No c/c/e NEURO Normal gait.  PSYCH: Normally interactive. Conversant. Not depressed or anxious appearing.  Calm demeanor.    Assessment and Plan: Sinusitis Serous otitis media Bronchitis augmentin mucinex d tussionex   Signed,  Phillips Odor, MD

## 2014-05-06 NOTE — Patient Instructions (Signed)
Sinusitis Sinusitis Sinusitis is redness, soreness, and inflammation of the paranasal sinuses. Paranasal sinuses are air pockets within the bones of your face (beneath the eyes, the middle of the forehead, or above the eyes). In healthy paranasal sinuses, mucus is able to drain out, and air is able to circulate through them by way of your nose. However, when your paranasal sinuses are inflamed, mucus and air can become trapped. This can allow bacteria and other germs to grow and cause infection. Sinusitis can develop quickly and last only a short time (acute) or continue over a long period (chronic). Sinusitis that lasts for more than 12 weeks is considered chronic.  CAUSES  Causes of sinusitis include:  Allergies.  Structural abnormalities, such as displacement of the cartilage that separates your nostrils (deviated septum), which can decrease the air flow through your nose and sinuses and affect sinus drainage.  Functional abnormalities, such as when the small hairs (cilia) that line your sinuses and help remove mucus do not work properly or are not present. SIGNS AND SYMPTOMS  Symptoms of acute and chronic sinusitis are the same. The primary symptoms are pain and pressure around the affected sinuses. Other symptoms include:  Upper toothache.  Earache.  Headache.  Bad breath.  Decreased sense of smell and taste.  A cough, which worsens when you are lying flat.  Fatigue.  Fever.  Thick drainage from your nose, which often is green and may contain pus (purulent).  Swelling and warmth over the affected sinuses. DIAGNOSIS  Your health care provider will perform a physical exam. During the exam, your health care provider may:  Look in your nose for signs of abnormal growths in your nostrils (nasal polyps).  Tap over the affected sinus to check for signs of infection.  View the inside of your sinuses (endoscopy) using an imaging device that has a light attached (endoscope). If  your health care provider suspects that you have chronic sinusitis, one or more of the following tests may be recommended:  Allergy tests.  Nasal culture. A sample of mucus is taken from your nose, sent to a lab, and screened for bacteria.  Nasal cytology. A sample of mucus is taken from your nose and examined by your health care provider to determine if your sinusitis is related to an allergy. TREATMENT  Most cases of acute sinusitis are related to a viral infection and will resolve on their own within 10 days. Sometimes medicines are prescribed to help relieve symptoms (pain medicine, decongestants, nasal steroid sprays, or saline sprays).  However, for sinusitis related to a bacterial infection, your health care provider will prescribe antibiotic medicines. These are medicines that will help kill the bacteria causing the infection.  Rarely, sinusitis is caused by a fungal infection. In theses cases, your health care provider will prescribe antifungal medicine. For some cases of chronic sinusitis, surgery is needed. Generally, these are cases in which sinusitis recurs more than 3 times per year, despite other treatments. HOME CARE INSTRUCTIONS   Drink plenty of water. Water helps thin the mucus so your sinuses can drain more easily.  Use a humidifier.  Inhale steam 3 to 4 times a day (for example, sit in the bathroom with the shower running).  Apply a warm, moist washcloth to your face 3 to 4 times a day, or as directed by your health care provider.  Use saline nasal sprays to help moisten and clean your sinuses.  Take medicines only as directed by your health care provider.    If you were prescribed either an antibiotic or antifungal medicine, finish it all even if you start to feel better. SEEK IMMEDIATE MEDICAL CARE IF:  You have increasing pain or severe headaches.  You have nausea, vomiting, or drowsiness.  You have swelling around your face.  You have vision problems.  You  have a stiff neck.  You have difficulty breathing. MAKE SURE YOU:   Understand these instructions.  Will watch your condition.  Will get help right away if you are not doing well or get worse. Document Released: 12/27/2004 Document Revised: 05/13/2013 Document Reviewed: 01/11/2011 [employer] Patient Information 2015 [employer]. This information is not intended to replace advice given to you by your health care provider. Make sure you discuss any questions you have with your health care provider.  

## 2014-07-21 ENCOUNTER — Encounter (HOSPITAL_COMMUNITY): Payer: Self-pay | Admitting: Emergency Medicine

## 2014-07-21 ENCOUNTER — Emergency Department (HOSPITAL_COMMUNITY): Payer: BLUE CROSS/BLUE SHIELD

## 2014-07-21 ENCOUNTER — Emergency Department (HOSPITAL_COMMUNITY)
Admission: EM | Admit: 2014-07-21 | Discharge: 2014-07-21 | Disposition: A | Discharge status: Home / Self Care | Payer: BLUE CROSS/BLUE SHIELD | Attending: Emergency Medicine | Admitting: Emergency Medicine

## 2014-07-21 DIAGNOSIS — K219 Gastro-esophageal reflux disease without esophagitis: Secondary | ICD-10-CM | POA: Diagnosis not present

## 2014-07-21 DIAGNOSIS — F329 Major depressive disorder, single episode, unspecified: Secondary | ICD-10-CM | POA: Insufficient documentation

## 2014-07-21 DIAGNOSIS — N50812 Left testicular pain: Secondary | ICD-10-CM

## 2014-07-21 DIAGNOSIS — Z792 Long term (current) use of antibiotics: Secondary | ICD-10-CM | POA: Diagnosis not present

## 2014-07-21 DIAGNOSIS — Z8739 Personal history of other diseases of the musculoskeletal system and connective tissue: Secondary | ICD-10-CM | POA: Diagnosis not present

## 2014-07-21 DIAGNOSIS — N508 Other specified disorders of male genital organs: Secondary | ICD-10-CM | POA: Diagnosis not present

## 2014-07-21 DIAGNOSIS — Z79899 Other long term (current) drug therapy: Secondary | ICD-10-CM | POA: Diagnosis not present

## 2014-07-21 DIAGNOSIS — N50811 Right testicular pain: Secondary | ICD-10-CM

## 2014-07-21 DIAGNOSIS — F419 Anxiety disorder, unspecified: Secondary | ICD-10-CM | POA: Insufficient documentation

## 2014-07-21 DIAGNOSIS — Z87891 Personal history of nicotine dependence: Secondary | ICD-10-CM | POA: Insufficient documentation

## 2014-07-21 MED ORDER — DOXYCYCLINE HYCLATE 100 MG PO CAPS: 100.0000 mg | ORAL_CAPSULE | Freq: Two times a day (BID) | ORAL | Status: DC

## 2014-07-21 MED ORDER — KETOROLAC TROMETHAMINE 60 MG/2ML IM SOLN
60.0000 mg | Freq: Once | INTRAMUSCULAR | Status: DC
  Filled 2014-07-21: qty 2

## 2014-07-21 NOTE — Discharge Instructions (Signed)
Epididymitis Epididymitis is a swelling (inflammation) of the epididymis. The epididymis is a cord-like structure along the back part of the testicle. Epididymitis is usually, but not always, caused by infection. This is usually a sudden problem beginning with chills, fever and pain behind the scrotum and in the testicle. There may be swelling and redness of the testicle. DIAGNOSIS  Physical examination will reveal a tender, swollen epididymis. Sometimes, cultures are obtained from the urine or from prostate secretions to help find out if there is an infection or if the cause is a different problem. Sometimes, blood work is performed to see if your white blood cell count is elevated and if a germ (bacterial) or viral infection is present. Using this knowledge, an appropriate medicine which kills germs (antibiotic) can be chosen by your caregiver. A viral infection causing epididymitis will most often go away (resolve) without treatment. HOME CARE INSTRUCTIONS   Hot sitz baths for 20 minutes, 4 times per day, may help relieve pain.  Only take over-the-counter or prescription medicines for pain, discomfort or fever as directed by your caregiver.  Take all medicines, including antibiotics, as directed. Take the antibiotics for the full prescribed length of time even if you are feeling better.  It is very important to keep all follow-up appointments. SEEK IMMEDIATE MEDICAL CARE IF:   You have a fever.  You have pain not relieved with medicines.  You have any worsening of your problems.  Your pain seems to come and go.  You develop pain, redness, and swelling in the scrotum and surrounding areas. MAKE SURE YOU:   Understand these instructions.  Will watch your condition.  Will get help right away if you are not doing well or get worse. Document Released: 12/25/1999 Document Revised: 03/21/2011 Document Reviewed: 11/13/2008 [facility] Patient Information 2015 [place], [place]. This information  is not intended to replace advice given to you by your health care provider. Make sure you discuss any questions you have with your health care provider. Epidermal Cyst An epidermal cyst is sometimes called a sebaceous cyst, epidermal inclusion cyst, or infundibular cyst. These cysts usually contain a substance that looks "pasty" or "cheesy" and may have a bad smell. This substance is a protein called keratin. Epidermal cysts are usually found on the face, neck, or trunk. They may also occur in the vaginal area or other parts of the genitalia of both men and women. Epidermal cysts are usually small, painless, slow-growing bumps or lumps that move freely under the skin. It is important not to try to pop them. This may cause an infection and lead to tenderness and swelling. CAUSES  Epidermal cysts may be caused by a deep penetrating injury to the skin or a plugged hair follicle, often associated with acne. SYMPTOMS  Epidermal cysts can become inflamed and cause:  Redness.  Tenderness.  Increased temperature of the skin over the bumps or lumps.  Grayish-white, bad smelling material that drains from the bump or lump. DIAGNOSIS  Epidermal cysts are easily diagnosed by your caregiver during an exam. Rarely, a tissue sample (biopsy) may be taken to rule out other conditions that may resemble epidermal cysts. TREATMENT   Epidermal cysts often get better and disappear on their own. They are rarely ever cancerous.  If a cyst becomes infected, it may become inflamed and tender. This may require opening and draining the cyst. Treatment with antibiotics may be necessary. When the infection is gone, the cyst may be removed with minor surgery.  Small, inflamed  cysts can often be treated with antibiotics or by injecting steroid medicines.  Sometimes, epidermal cysts become large and bothersome. If this happens, surgical removal in your caregiver's office may be necessary. HOME CARE INSTRUCTIONS  Only  take over-the-counter or prescription medicines as directed by your caregiver.  Take your antibiotics as directed. Finish them even if you start to feel better. SEEK MEDICAL CARE IF:   Your cyst becomes tender, red, or swollen.  Your condition is not improving or is getting worse.  You have any other questions or concerns. MAKE SURE YOU:  Understand these instructions.  Will watch your condition.  Will get help right away if you are not doing well or get worse. Document Released: 11/28/2003 Document Revised: 03/21/2011 Document Reviewed: 07/05/2010 [facility] Patient Information 2015 [place]. This information is not intended to replace advice given to you by your health care provider. Make sure you discuss any questions you have with your health care provider.

## 2014-07-21 NOTE — ED Notes (Signed)
Patient coming from home with c/o of left testicular pain ongoing x 1 week.  Patient states the pain woke him up with sudden onset of pain this morning.

## 2014-07-21 NOTE — ED Notes (Signed)
Dr. Gentry at bedside. 

## 2014-07-21 NOTE — ED Notes (Signed)
Dr. Oni at bedside. 

## 2014-07-21 NOTE — ED Notes (Signed)
MD at bedside. 
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
## 2014-09-01 ENCOUNTER — Emergency Department (HOSPITAL_COMMUNITY)
Admission: EM | Admit: 2014-09-01 | Discharge: 2014-09-01 | Disposition: A | Discharge status: Home / Self Care | Payer: BLUE CROSS/BLUE SHIELD | Attending: Emergency Medicine | Admitting: Emergency Medicine

## 2014-09-01 ENCOUNTER — Encounter (HOSPITAL_COMMUNITY): Payer: Self-pay | Admitting: Vascular Surgery

## 2014-09-01 DIAGNOSIS — S098XXA Other specified injuries of head, initial encounter: Secondary | ICD-10-CM

## 2014-09-01 DIAGNOSIS — S0101XA Laceration without foreign body of scalp, initial encounter: Secondary | ICD-10-CM | POA: Diagnosis not present

## 2014-09-01 DIAGNOSIS — M1990 Unspecified osteoarthritis, unspecified site: Secondary | ICD-10-CM | POA: Insufficient documentation

## 2014-09-01 DIAGNOSIS — W25XXXA Contact with sharp glass, initial encounter: Secondary | ICD-10-CM | POA: Diagnosis not present

## 2014-09-01 DIAGNOSIS — Y998 Other external cause status: Secondary | ICD-10-CM | POA: Diagnosis not present

## 2014-09-01 DIAGNOSIS — Y9389 Activity, other specified: Secondary | ICD-10-CM | POA: Diagnosis not present

## 2014-09-01 DIAGNOSIS — Z792 Long term (current) use of antibiotics: Secondary | ICD-10-CM | POA: Diagnosis not present

## 2014-09-01 DIAGNOSIS — F419 Anxiety disorder, unspecified: Secondary | ICD-10-CM | POA: Diagnosis not present

## 2014-09-01 DIAGNOSIS — Z79899 Other long term (current) drug therapy: Secondary | ICD-10-CM | POA: Diagnosis not present

## 2014-09-01 DIAGNOSIS — K219 Gastro-esophageal reflux disease without esophagitis: Secondary | ICD-10-CM | POA: Insufficient documentation

## 2014-09-01 DIAGNOSIS — Y9289 Other specified places as the place of occurrence of the external cause: Secondary | ICD-10-CM | POA: Insufficient documentation

## 2014-09-01 DIAGNOSIS — Z87891 Personal history of nicotine dependence: Secondary | ICD-10-CM | POA: Diagnosis not present

## 2014-09-01 DIAGNOSIS — F329 Major depressive disorder, single episode, unspecified: Secondary | ICD-10-CM | POA: Diagnosis not present

## 2014-09-01 MED ORDER — IBUPROFEN 600 MG PO TABS: 600.0000 mg | ORAL_TABLET | Freq: Four times a day (QID) | ORAL | Status: DC | PRN
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
## 2014-09-01 NOTE — ED Notes (Signed)
Declined W/C at D/C and was escorted to lobby by RN. 

## 2014-09-01 NOTE — Discharge Instructions (Signed)
Head Injury °You have a head injury. Headaches and throwing up (vomiting) are common after a head injury. It should be easy to wake up from sleeping. Sometimes you must stay in the hospital. Most problems happen within the first 24 hours. Side effects may occur up to 7-10 days after the injury.  °WHAT ARE THE TYPES OF HEAD INJURIES? °Head injuries can be as minor as a bump. Some head injuries can be more severe. More severe head injuries include: °· A jarring injury to the brain (concussion). °· A bruise of the brain (contusion). This mean there is bleeding in the brain that can cause swelling. °· A cracked skull (skull fracture). °· Bleeding in the brain that collects, clots, and forms a bump (hematoma). °WHEN SHOULD I GET HELP RIGHT AWAY?  °· You are confused or sleepy. °· You cannot be woken up. °· You feel sick to your stomach (nauseous) or keep throwing up (vomiting). °· Your dizziness or unsteadiness is getting worse. °· You have very bad, lasting headaches that are not helped by medicine. Take medicines only as told by your doctor. °· You cannot use your arms or legs like normal. °· You cannot walk. °· You notice changes in the black spots in the center of the colored part of your eye (pupil). °· You have clear or bloody fluid coming from your nose or ears. °· You have trouble seeing. °During the next 24 hours after the injury, you must stay with someone who can watch you. This person should get help right away (call 911 in the [place] if you start to shake and are not able to control it (have seizures), you pass out, or you are unable to wake up. °HOW CAN I PREVENT A HEAD INJURY IN THE FUTURE? °· Wear seat belts. °· Wear a helmet while bike riding and playing sports like football. °· Stay away from dangerous activities around the house. °WHEN CAN I RETURN TO NORMAL ACTIVITIES AND ATHLETICS? °See your doctor before doing these activities. You should not do normal activities or play contact sports until 1 week  after the following symptoms have stopped: °· Headache that does not go away. °· Dizziness. °· Poor attention. °· Confusion. °· Memory problems. °· Sickness to your stomach or throwing up. °· Tiredness. °· Fussiness. °· Bothered by bright lights or loud noises. °· Anxiousness or depression. °· Restless sleep. °MAKE SURE YOU:  °· Understand these instructions. °· Will watch your condition. °· Will get help right away if you are not doing well or get worse. °Document Released: 12/10/2007 Document Revised: 05/13/2013 Document Reviewed: 09/03/2012 °ExitCare® Patient Information ©2015 [employer]. This information is not intended to replace advice given to you by your health care provider. Make sure you discuss any questions you have with your health care provider. °Laceration Care, Adult °A laceration is a cut or lesion that goes through all layers of the skin and into the tissue just beneath the skin. °TREATMENT  °Some lacerations may not require closure. Some lacerations may not be able to be closed due to an increased risk of infection. It is important to see your caregiver as soon as possible after an injury to minimize the risk of infection and maximize the opportunity for successful closure. °If closure is appropriate, pain medicines may be given, if needed. The wound will be cleaned to help prevent infection. Your caregiver will use stitches (sutures), staples, wound glue (adhesive), or skin adhesive strips to repair the laceration. These tools bring the   skin edges together to allow for faster healing and a better cosmetic outcome. However, all wounds will heal with a scar. Once the wound has healed, scarring can be minimized by covering the wound with sunscreen during the day for 1 full year. °HOME CARE INSTRUCTIONS  °For sutures or staples: °· Keep the wound clean and dry. °· If you were given a bandage (dressing), you should change it at least once a day. Also, change the dressing if it becomes wet or dirty,  or as directed by your caregiver. °· Wash the wound with soap and water 2 times a day. Rinse the wound off with water to remove all soap. Pat the wound dry with a clean towel. °· After cleaning, apply a thin layer of the antibiotic ointment as recommended by your caregiver. This will help prevent infection and keep the dressing from sticking. °· You may shower as usual after the first 24 hours. Do not soak the wound in water until the sutures are removed. °· Only take over-the-counter or prescription medicines for pain, discomfort, or fever as directed by your caregiver. °· Get your sutures or staples removed as directed by your caregiver. °For skin adhesive strips: °· Keep the wound clean and dry. °· Do not get the skin adhesive strips wet. You may bathe carefully, using caution to keep the wound dry. °· If the wound gets wet, pat it dry with a clean towel. °· Skin adhesive strips will fall off on their own. You may trim the strips as the wound heals. Do not remove skin adhesive strips that are still stuck to the wound. They will fall off in time. °For wound adhesive: °· You may briefly wet your wound in the shower or bath. Do not soak or scrub the wound. Do not swim. Avoid periods of heavy perspiration until the skin adhesive has fallen off on its own. After showering or bathing, gently pat the wound dry with a clean towel. °· Do not apply liquid medicine, cream medicine, or ointment medicine to your wound while the skin adhesive is in place. This may loosen the film before your wound is healed. °· If a dressing is placed over the wound, be careful not to apply tape directly over the skin adhesive. This may cause the adhesive to be pulled off before the wound is healed. °· Avoid prolonged exposure to sunlight or tanning lamps while the skin adhesive is in place. Exposure to ultraviolet light in the first year will darken the scar. °· The skin adhesive will usually remain in place for 5 to 10 days, then naturally  fall off the skin. Do not pick at the adhesive film. °You may need a tetanus shot if: °· You cannot remember when you had your last tetanus shot. °· You have never had a tetanus shot. °If you get a tetanus shot, your arm may swell, get red, and feel warm to the touch. This is common and not a problem. If you need a tetanus shot and you choose not to have one, there is a rare chance of getting tetanus. Sickness from tetanus can be serious. °SEEK MEDICAL CARE IF:  °· You have redness, swelling, or increasing pain in the wound. °· You see a red line that goes away from the wound. °· You have yellowish-white fluid (pus) coming from the wound. °· You have a fever. °· You notice a bad smell coming from the wound or dressing. °· Your wound breaks open before or after sutures have   been removed. °· You notice something coming out of the wound such as wood or glass. °· Your wound is on your hand or foot and you cannot move a finger or toe. °SEEK IMMEDIATE MEDICAL CARE IF:  °· Your pain is not controlled with prescribed medicine. °· You have severe swelling around the wound causing pain and numbness or a change in color in your arm, hand, leg, or foot. °· Your wound splits open and starts bleeding. °· You have worsening numbness, weakness, or loss of function of any joint around or beyond the wound. °· You develop painful lumps near the wound or on the skin anywhere on your body. °MAKE SURE YOU:  °· Understand these instructions. °· Will watch your condition. °· Will get help right away if you are not doing well or get worse. °Document Released: 12/27/2004 Document Revised: 03/21/2011 Document Reviewed: 06/22/2010 °ExitCare® Patient Information ©2015 [employer]. This information is not intended to replace advice given to you by your health care provider. Make sure you discuss any questions you have with your health care provider. ° °
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
## 2015-07-06 ENCOUNTER — Ambulatory Visit (INDEPENDENT_AMBULATORY_CARE_PROVIDER_SITE_OTHER): Payer: BLUE CROSS/BLUE SHIELD | Admitting: Emergency Medicine

## 2015-07-06 ENCOUNTER — Ambulatory Visit (INDEPENDENT_AMBULATORY_CARE_PROVIDER_SITE_OTHER): Payer: BLUE CROSS/BLUE SHIELD

## 2015-07-06 VITALS — BP 122/72 | HR 91 | Temp 98.2°F | Resp 17 | Ht 71.5 in | Wt 196.0 lb

## 2015-07-06 DIAGNOSIS — M542 Cervicalgia: Secondary | ICD-10-CM | POA: Diagnosis not present

## 2015-07-06 DIAGNOSIS — M47812 Spondylosis without myelopathy or radiculopathy, cervical region: Secondary | ICD-10-CM | POA: Diagnosis not present

## 2015-07-06 LAB — POCT RAPID STREP A (OFFICE): Rapid Strep A Screen: NEGATIVE

## 2015-07-06 NOTE — Patient Instructions (Addendum)
   IF you received an x-ray today, you will receive an invoice from [facility]. Please contact [facility] at [phone] with questions or concerns regarding your invoice.   IF you received labwork today, you will receive an invoice from [facility]. Please contact [facility] at [phone] with questions or concerns regarding your invoice.   Our billing staff will not be able to assist you with questions regarding bills from these companies.  You will be contacted with the lab results as soon as they are available. The fastest way to get your results is to activate your My Chart account. Instructions are located on the last page of this paperwork. If you have not heard from us regarding the results in 2 weeks, please contact this office.     Cervical Radiculopathy Cervical radiculopathy happens when a nerve in the neck (cervical nerve) is pinched or bruised. This condition can develop because of an injury or as part of the normal aging process. Pressure on the cervical nerves can cause pain or numbness that runs from the neck all the way down into the arm and fingers. Usually, this condition gets better with rest. Treatment may be needed if the condition does not improve.  CAUSES This condition may be caused by:  Injury.  Slipped (herniated) disk.  Muscle tightness in the neck because of overuse.  Arthritis.  Breakdown or degeneration in the bones and joints of the spine (spondylosis) due to aging.  Bone spurs that may develop near the cervical nerves. SYMPTOMS Symptoms of this condition include:  Pain that runs from the neck to the arm and hand. The pain can be severe or irritating. It may be worse when the neck is moved.  Numbness or weakness in the affected arm and hand. DIAGNOSIS This condition may be diagnosed based on symptoms, medical history, and a physical exam. You may also have tests, including:  X-rays.  CT  scan.  MRI.  Electromyogram (EMG).  Nerve conduction tests. TREATMENT In many cases, treatment is not needed for this condition. With rest, the condition usually gets better over time. If treatment is needed, options may include:  Wearing a soft neck collar for short periods of time.  Physical therapy to strengthen your neck muscles.  Medicines, such as NSAIDs, oral corticosteroids, or spinal injections.  Surgery. This may be needed if other treatments do not help. Various types of surgery may be done depending on the cause of your problems. HOME CARE INSTRUCTIONS Managing Pain  Take over-the-counter and prescription medicines only as told by your health care provider.  If directed, apply ice to the affected area.  Put ice in a plastic bag.  Place a towel between your skin and the bag.  Leave the ice on for 20 minutes, 2-3 times per day.  If ice does not help, you can try using heat. Take a warm shower or warm bath, or use a heat pack as told by your health care provider.  Try a gentle neck and shoulder massage to help relieve symptoms. Activity  Rest as needed. Follow instructions from your health care provider about any restrictions on activities.  Do stretching and strengthening exercises as told by your health care provider or physical therapist. General Instructions  If you were given a soft collar, wear it as told by your health care provider.  Use a flat pillow when you sleep.  Keep all follow-up visits as told by your health care provider. This is important. SEEK   MEDICAL CARE IF:  Your condition does not improve with treatment. SEEK IMMEDIATE MEDICAL CARE IF:  Your pain gets much worse and cannot be controlled with medicines.  You have weakness or numbness in your hand, arm, face, or leg.  You have a high fever.  You have a stiff, rigid neck.  You lose control of your bowels or your bladder (have incontinence).  You have trouble with walking,  balance, or speaking.   This information is not intended to replace advice given to you by your health care provider. Make sure you discuss any questions you have with your health care provider.   Document Released: 09/21/2000 Document Revised: 09/17/2014 Document Reviewed: 02/20/2014 Elsevier Interactive Patient Education 2016 Elsevier Inc.  

## 2015-07-06 NOTE — Progress Notes (Addendum)
Subjective:  This chart was scribed for Lesle ChrisSteven Draken Farrior MD, by Veverly FellsHatice Demirci,scribe, at [facility].  This patient was seen in room  2  and the patient's care was started at 10:45 AM.   Chief Complaint  Patient presents with  . Sinusitis  . neck swelling     Patient ID: Henry ChamberlainJason Collins, male    DOB: 01-Jul-1980, 35 y.o.   MRN: [id number]  HPI HPI Comments: Henry Collins is a 35 y.o. male who presents to the [facility] complaining of neck swelling/shooting pain onset 5-6 weeks ago which gave him discomfort/cramping sensation when he turned his head/neck. His symptoms resolved at that time and went away for a week but then came back.  Patient notes that he feels discomfort in his ears as well which he is unable to clearly describe and states that he also has a slight cough (which he did not have in the past).  He denies a sore throat, headache or sinus pressure. He took over the counter sinus medication but denies any relief or change in his symptoms. Patient took left over doxycycline but is unsure if it helped alleviate any of his symptoms as it was expired.  Patient has not been going to the gym or doing any heavy lifting.  He works in an office setting and states that he has not been doing anything different in terms of his activity.   Patient works with [employer].    Patient Active Problem List   Diagnosis Date Noted  . HEMORRHOIDS, INTERNAL 09/24/2009  . GERD 09/24/2009  . RECTAL BLEEDING 09/24/2009   Past Medical History  Diagnosis Date  . Allergy   . Arthritis   . Anxiety   . Depression   . GERD (gastroesophageal reflux disease)    No past surgical history on file. No Known Allergies Prior to Admission medications   Medication Sig Start Date End Date Taking? Authorizing Provider  NEXIUM 40 MG capsule TAKE ONE CAPSULE BY MOUTH 30 MINUTES PRIOR TO BREAKFAST 08/08/11  Yes Amy Oswald HillockS Ester[place], PA-C   Social History   Social History  .  Marital Status: Single    Spouse Name: N/A  . Number of Children: N/A  . Years of Education: N/A   Occupational History  . Maintance    Social History Main Topics  . Smoking status: Former Smoker    Quit date: 06/30/2008  . Smokeless tobacco: Not on file  . Alcohol Use: No  . Drug Use: No  . Sexual Activity: Not on file   Other Topics Concern  . Not on file   Social History Narrative       Review of Systems  Constitutional: Negative for fever and chills.  HENT: Positive for ear pain. Negative for sore throat.   Respiratory: Positive for cough.   Gastrointestinal: Negative for nausea and vomiting.  Musculoskeletal: Positive for neck pain. Negative for back pain.  Neurological: Negative for headaches.       Objective:   Physical Exam  Filed Vitals:   07/06/15 1024  BP: 122/72  Pulse: 91  Temp: 98.2 F (36.8 C)  TempSrc: Oral  Resp: 17  Height: 5' 11.5" (1.816 m)  Weight: 196 lb (88.905 kg)  SpO2: 99%    CONSTITUTIONAL: Well developed/well nourished HEAD: Normocephalic/atraumatic EYES: EOMI/PERRL ENMT: Mucous membranes moistThere is tenderness over the left TMJ. NECK: Tender over the body of the left sternal mastoid.   CV: S1/S2 noted, no murmurs/rubs/gallops  noted LUNGS: Lungs are clear to auscultation bilaterally, no apparent distress NEURO: Pt is awake/alert/appropriate, moves all extremitiesx4.  No facial droop.   EXTREMITIES: pulses normal/equal, full ROM SKIN: warm, color normal PSYCH: no abnormalities of mood noted, alert and oriented to situation  Results for orders placed or performed in visit on 07/06/15  POCT rapid strep A  Result Value Ref Range   Rapid Strep A Screen Negative Negative   Dg Cervical Spine Complete  07/06/2015  CLINICAL DATA:  Left neck pain EXAM: CERVICAL SPINE - COMPLETE 4+ VIEW COMPARISON:  None. FINDINGS: Mild disc degeneration and mild uncinate spurring at C5-6 bilaterally causing mild foraminal narrowing. Remaining  disc spaces normal. No fracture or mass lesion. 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  No acute abnormality. Electronically Signed   By: Marlan Palauharles  Clark M.D.   On: 07/06/2015 11:38   Assessment and Plan     Patient advised to take a nonsteroidal when he has pain. I did give him information on cervical disc disease.  I did not feel any pathology on neck exam. He also has some degree of TMJ. He is tender over the left jaw joint. He has been advised to have braces to help with this but he is not financially ready.I personally performed the services described in this documentation, which was scribed in my presence. The recorded information has been reviewed and is accurate.   Lesle ChrisSteven [place] Huberty MD,

## 2015-08-04 DIAGNOSIS — K219 Gastro-esophageal reflux disease without esophagitis: Secondary | ICD-10-CM | POA: Diagnosis not present

## 2015-08-14 ENCOUNTER — Other Ambulatory Visit: Payer: BLUE CROSS/BLUE SHIELD

## 2015-08-14 ENCOUNTER — Ambulatory Visit
Admission: RE | Admit: 2015-08-14 | Discharge: 2015-08-14 | Disposition: A | Discharge status: Home / Self Care | Payer: BLUE CROSS/BLUE SHIELD | Source: Ambulatory Visit | Attending: Sports Medicine | Admitting: Sports Medicine

## 2015-08-14 ENCOUNTER — Other Ambulatory Visit: Payer: Self-pay | Admitting: Sports Medicine

## 2015-08-14 DIAGNOSIS — R1011 Right upper quadrant pain: Secondary | ICD-10-CM | POA: Diagnosis not present

## 2015-08-14 DIAGNOSIS — R5381 Other malaise: Secondary | ICD-10-CM | POA: Diagnosis not present

## 2015-08-14 DIAGNOSIS — K219 Gastro-esophageal reflux disease without esophagitis: Secondary | ICD-10-CM | POA: Diagnosis not present

## 2015-08-14 DIAGNOSIS — R110 Nausea: Secondary | ICD-10-CM | POA: Diagnosis not present

## 2015-12-28 ENCOUNTER — Encounter (HOSPITAL_COMMUNITY): Payer: Self-pay

## 2015-12-28 ENCOUNTER — Ambulatory Visit (INDEPENDENT_AMBULATORY_CARE_PROVIDER_SITE_OTHER): Payer: BLUE CROSS/BLUE SHIELD | Admitting: Family Medicine

## 2015-12-28 VITALS — BP 130/88 | HR 82 | Temp 98.0°F | Resp 17 | Ht 71.5 in | Wt 190.0 lb

## 2015-12-28 DIAGNOSIS — Z8659 Personal history of other mental and behavioral disorders: Secondary | ICD-10-CM | POA: Diagnosis not present

## 2015-12-28 DIAGNOSIS — F411 Generalized anxiety disorder: Secondary | ICD-10-CM

## 2015-12-28 MED ORDER — ESCITALOPRAM OXALATE 5 MG PO TABS: ORAL_TABLET | ORAL | 0 refills | Status: DC

## 2015-12-28 NOTE — Patient Instructions (Addendum)
   IF you received an x-ray today, you will receive an invoice from [facility]. Please contact [facility] at [phone] with questions or concerns regarding your invoice.   IF you received labwork today, you will receive an invoice from [facility]. Please contact [facility] at [phone] with questions or concerns regarding your invoice.   Our billing staff will not be able to assist you with questions regarding bills from these companies.  You will be contacted with the lab results as soon as they are available. The fastest way to get your results is to activate your My Chart account. Instructions are located on the last page of this paperwork. If you have not heard from us regarding the results in 2 weeks, please contact this office.     Generalized Anxiety Disorder Generalized anxiety disorder (GAD) is a mental disorder. It interferes with life functions, including relationships, work, and school. GAD is different from normal anxiety, which everyone experiences at some point in their lives in response to specific life events and activities. Normal anxiety actually helps us prepare for and get through these life events and activities. Normal anxiety goes away after the event or activity is over.  GAD causes anxiety that is not necessarily related to specific events or activities. It also causes excess anxiety in proportion to specific events or activities. The anxiety associated with GAD is also difficult to control. GAD can vary from mild to severe. People with severe GAD can have intense waves of anxiety with physical symptoms (panic attacks).  SYMPTOMS The anxiety and worry associated with GAD are difficult to control. This anxiety and worry are related to many life events and activities and also occur more days than not for 6 months or longer. People with GAD also have three or more of the following symptoms (one or more in children):  Restlessness.    Fatigue.  Difficulty concentrating.   Irritability.  Muscle tension.  Difficulty sleeping or unsatisfying sleep. DIAGNOSIS GAD is diagnosed through an assessment by your health care provider. Your health care provider will ask you questions aboutyour mood,physical symptoms, and events in your life. Your health care provider may ask you about your medical history and use of alcohol or drugs, including prescription medicines. Your health care provider may also do a physical exam and blood tests. Certain medical conditions and the use of certain substances can cause symptoms similar to those associated with GAD. Your health care provider may refer you to a mental health specialist for further evaluation. TREATMENT The following therapies are usually used to treat GAD:   Medication. Antidepressant medication usually is prescribed for long-term daily control. Antianxiety medicines may be added in severe cases, especially when panic attacks occur.   Talk therapy (psychotherapy). Certain types of talk therapy can be helpful in treating GAD by providing support, education, and guidance. A form of talk therapy called cognitive behavioral therapy can teach you healthy ways to think about and react to daily life events and activities.  Stress managementtechniques. These include yoga, meditation, and exercise and can be very helpful when they are practiced regularly. A mental health specialist can help determine which treatment is best for you. Some people see improvement with one therapy. However, other people require a combination of therapies. This information is not intended to replace advice given to you by your health care provider. Make sure you discuss any questions you have with your health care provider. Document Released: 04/23/2012 Document Revised: 01/17/2014 Document Reviewed: 04/23/2012 Elsevier   Interactive Patient Education  2017 Elsevier Inc.  

## 2015-12-28 NOTE — Progress Notes (Signed)
Chief Complaint  Patient presents with  . loss of appetite    Pt beleives related to anxiety     HPI   Patient reports that he has not slept well and feels like his brain is going 500 miles an hour. He states that he has given up and reports that the medications  Ativan did not work for him and only made him sleep Paxil gave him more angry outbursts Hydroxyzine also made him sleep He tried to get in with Psychiatry but it is a self-referral.   He states that he has not been eating and sleeping and reports that the anxiety have gotten worse He is now biting and grinding his teeth He states that he is also irritable.  He states that 2012 he noted early signs of anxiety that has progressively worse As a teenager he was diagnosed with ADHD  GAD 7 : Generalized Anxiety Score 12/28/2015  Nervous, Anxious, on Edge 3  Control/stop worrying 3  Worry too much - different things 3  Trouble relaxing 3  Restless 3  Easily annoyed or irritable 3  Afraid - awful might happen 3  Total GAD 7 Score 21  Anxiety Difficulty Extremely difficult      Past Medical History:  Diagnosis Date  . Allergy   . Anxiety   . Arthritis   . Depression   . GERD (gastroesophageal reflux disease)     Current Outpatient Prescriptions  Medication Sig Dispense Refill  . NEXIUM 40 MG capsule TAKE ONE CAPSULE BY MOUTH 30 MINUTES PRIOR TO BREAKFAST 30 capsule 3  . escitalopram (LEXAPRO) 5 MG tablet Take 5mg  daily for a week then increase to 10mg  daily. 30 tablet 0   No current facility-administered medications for this visit.     Allergies: No Known Allergies  No past surgical history on file.  Social History   Social History  . Marital status: Single    Spouse name: N/A  . Number of children: N/A  . Years of education: N/A   Occupational History  . Maintance    Social History Main Topics  . Smoking status: Former Smoker    Quit date: 06/30/2008  . Smokeless tobacco: None  . Alcohol use No    . Drug use: No  . Sexual activity: Not Asked   Other Topics Concern  . None   Social History Narrative  . None    ROS   Objective: Vitals:   12/28/15 0815  BP: 130/88  Pulse: 82  Resp: 17  Temp: 98 F (36.7 C)  TempSrc: Oral  SpO2: 99%  Weight: 190 lb (86.2 kg)  Height: 5' 11.5" (1.816 m)    Physical Exam  Constitutional: He is oriented to person, place, and time. He appears well-developed and well-nourished.  HENT:  Head: Normocephalic and atraumatic.  Eyes: Conjunctivae and EOM are normal.  Cardiovascular: Normal rate, regular rhythm and normal heart sounds.   Pulmonary/Chest: Effort normal and breath sounds normal. No respiratory distress. He has no wheezes.  Neurological: He is alert and oriented to person, place, and time.     Assessment and Plan [name] was seen today for loss of appetite.  Diagnoses and all orders for this visit:  Generalized anxiety disorder- chronic insufficiently treated anxiety Discussed exercise and it's importance Discussed zoloft and lexapro and their indications, efficacy and expected drug response Pt elected lexapro Plans to follow up with Psychiatry -     Ambulatory referral to Psychiatry  History of ADHD- due to  history of ADHD will refer to Psychiatry since ADHD and anxiety can have overlapping symptoms -     Ambulatory referral to Psychiatry  Other orders -     escitalopram (LEXAPRO) 5 MG tablet; Take 5mg  daily for a week then increase to 10mg  daily.    A total of 30 minutes were spent face-to-face with the patient during this encounter and over half of that time was spent on counseling and coordination of care.  Kaylean Tupou A Sena Hoopingarner

## 2016-01-01 ENCOUNTER — Encounter (HOSPITAL_COMMUNITY): Payer: Self-pay | Admitting: Psychiatry

## 2016-01-01 ENCOUNTER — Ambulatory Visit (INDEPENDENT_AMBULATORY_CARE_PROVIDER_SITE_OTHER): Payer: BLUE CROSS/BLUE SHIELD | Admitting: Psychiatry

## 2016-01-01 VITALS — BP 122/74 | HR 69 | Ht 73.0 in | Wt 192.4 lb

## 2016-01-01 DIAGNOSIS — Z87891 Personal history of nicotine dependence: Secondary | ICD-10-CM | POA: Diagnosis not present

## 2016-01-01 DIAGNOSIS — Z79899 Other long term (current) drug therapy: Secondary | ICD-10-CM | POA: Diagnosis not present

## 2016-01-01 DIAGNOSIS — F411 Generalized anxiety disorder: Secondary | ICD-10-CM | POA: Diagnosis not present
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
## 2016-01-06 ENCOUNTER — Ambulatory Visit (HOSPITAL_COMMUNITY): Payer: BLUE CROSS/BLUE SHIELD | Admitting: Psychiatry

## 2016-01-12 ENCOUNTER — Other Ambulatory Visit: Payer: Self-pay | Admitting: Family Medicine

## 2016-01-14 MED ORDER — ESCITALOPRAM OXALATE 10 MG PO TABS: 10.0000 mg | ORAL_TABLET | Freq: Every day | ORAL | 6 refills | Status: DC

## 2016-01-18 ENCOUNTER — Ambulatory Visit (INDEPENDENT_AMBULATORY_CARE_PROVIDER_SITE_OTHER): Payer: BLUE CROSS/BLUE SHIELD | Admitting: Psychology

## 2016-01-18 ENCOUNTER — Encounter (HOSPITAL_COMMUNITY): Payer: Self-pay | Admitting: Psychology

## 2016-01-18 DIAGNOSIS — F411 Generalized anxiety disorder: Secondary | ICD-10-CM | POA: Diagnosis not present
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
## 2016-02-04 ENCOUNTER — Ambulatory Visit (INDEPENDENT_AMBULATORY_CARE_PROVIDER_SITE_OTHER): Payer: BLUE CROSS/BLUE SHIELD | Admitting: Psychology

## 2016-02-04 DIAGNOSIS — F411 Generalized anxiety disorder: Secondary | ICD-10-CM

## 2016-02-04 NOTE — Progress Notes (Signed)
   THERAPIST PROGRESS NOTE  Session Time: 10.03am-10.50am  Participation Level: Active  Behavioral Response: Well GroomedAlertaffect wnl  Type of Therapy: Individual Therapy  Treatment Goals addressed: Diagnosis: GAD and goal 1.  Interventions: CBT  Summary: Henry ChamberlainJason Myler is a 36 y.o. male who presents with full and bright affect. Pt reported that he is feeling more at ease and not as irritable and is noticing this in interactions w/ others.  Pt discussed conflict w/ customer and how he was able to be assertive in the situation and not take personally. Pt discussed relationship w/ his son's mother and ways he is also trying to handled conflict w/ her more assertive. Pt reports that he struggles to move forward w/ other relationships as impacted by feelings for her.  Pt reports keeping good boundaries on his end and just working on communicating effectively for parenting..   Suicidal/Homicidal: Nowithout intent/plan  Therapist Response: Assessed pt current functioning per pt report.t  Processed w/pt decreased anxiety.  Discussed conflict style and how impacting relationships and interactions.  Encouraged boundaries and effective communicating for parenting w/ ex.   Plan: Return again in 2 weeks.  Diagnosis: GAD    [name], [facility] 02/04/2016

## 2016-02-17 ENCOUNTER — Ambulatory Visit (INDEPENDENT_AMBULATORY_CARE_PROVIDER_SITE_OTHER): Payer: Self-pay | Admitting: Family Medicine

## 2016-02-17 VITALS — BP 126/76 | HR 84 | Temp 98.1°F | Resp 16 | Ht 72.0 in | Wt 196.0 lb

## 2016-02-17 DIAGNOSIS — Z113 Encounter for screening for infections with a predominantly sexual mode of transmission: Secondary | ICD-10-CM | POA: Diagnosis not present

## 2016-02-17 NOTE — Progress Notes (Signed)
   Henry ChamberlainJason Collins is a 36 y.o. male who [facility] today for STD screen:  1.  STD screen:  Patient presents today for STD screen.  States longterm sexual partner was "cheating" on him and he would liked to be checked.  Denies any symptoms.  No fevers, chills, flu like symptoms.  No swelling in groin or elsewhere.  No abd pain/N/V.    No dysuria/nocturia.  No penile discharge or pain.   ROS as above.  PMH reviewed. Patient is a nonsmoker.   Past Medical History:  Diagnosis Date  . Allergy   . Anxiety   . Arthritis   . GERD (gastroesophageal reflux disease)    Past Surgical History:  Procedure Laterality Date  . ARTERIAL THROMBECTOMY Right    Accident at work    Medications reviewed. Current Outpatient Prescriptions  Medication Sig Dispense Refill  . escitalopram (LEXAPRO) 10 MG tablet Take 1 tablet (10 mg total) by mouth daily. 30 tablet 6  . NEXIUM 40 MG capsule TAKE ONE CAPSULE BY MOUTH 30 MINUTES PRIOR TO BREAKFAST 30 capsule 3   No current facility-administered medications for this visit.      Physical Exam:  BP 126/76 (BP Location: Right Arm, Patient Position: Sitting, Cuff Size: Normal)   Pulse 84   Temp 98.1 F (36.7 C) (Oral)   Resp 16   Ht 6' (1.829 m)   Wt 196 lb (88.9 kg)   SpO2 99%   BMI 26.58 kg/m  Gen:  Alert, cooperative patient who appears stated age in no acute distress.  Vital signs reviewed. HEENT: EOMI,  MMM Neck:  No LAD Pulm:  Clear to auscultation bilaterally with good air movement.  No wheezes or rales noted.   Cardiac:  Regular rate and rhythm without murmur auscultated.  Good S1/S2. Abd:  Soft/nondistended/nontender.  Exts: Non edematous BL  LE, warm and well perfused.  GU: deferred at request (no lesions per patient)  Assessment and Plan:  1.  STD screen: - no symptoms.  - secondary to concern for exposure - checking blood and urine today - will send results to patient.

## 2016-02-17 NOTE — Patient Instructions (Addendum)
It was good to meet you today.  Sign up for Mychart and I can send you the results of your labs via email.  We will let you know the results by the end of this week.  Take care    IF you received an x-ray today, you will receive an invoice from [facility]. Please contact [facility] at [phone] with questions or concerns regarding your invoice.   IF you received labwork today, you will receive an invoice from [facility]. Please contact [facility] at [phone] with questions or concerns regarding your invoice.   Our billing staff will not be able to assist you with questions regarding bills from these companies.  You will be contacted with the lab results as soon as they are available. The fastest way to get your results is to activate your My Chart account. Instructions are located on the last page of this paperwork. If you have not heard from us regarding the results in 2 weeks, please contact this office.

## 2016-02-18 ENCOUNTER — Encounter (HOSPITAL_COMMUNITY): Payer: Self-pay | Admitting: Psychiatry

## 2016-02-18 ENCOUNTER — Ambulatory Visit (INDEPENDENT_AMBULATORY_CARE_PROVIDER_SITE_OTHER): Payer: BLUE CROSS/BLUE SHIELD | Admitting: Psychiatry

## 2016-02-18 VITALS — BP 126/78 | HR 83 | Ht 72.0 in | Wt 195.6 lb

## 2016-02-18 DIAGNOSIS — Z818 Family history of other mental and behavioral disorders: Secondary | ICD-10-CM

## 2016-02-18 DIAGNOSIS — Z87891 Personal history of nicotine dependence: Secondary | ICD-10-CM

## 2016-02-18 DIAGNOSIS — Z79899 Other long term (current) drug therapy: Secondary | ICD-10-CM | POA: Diagnosis not present

## 2016-02-18 DIAGNOSIS — F411 Generalized anxiety disorder: Secondary | ICD-10-CM

## 2016-02-18 DIAGNOSIS — Z803 Family history of malignant neoplasm of breast: Secondary | ICD-10-CM

## 2016-02-18 DIAGNOSIS — Z9889 Other specified postprocedural states: Secondary | ICD-10-CM | POA: Diagnosis not present

## 2016-02-18 DIAGNOSIS — Z811 Family history of alcohol abuse and dependence: Secondary | ICD-10-CM

## 2016-02-18 DIAGNOSIS — Z8249 Family history of ischemic heart disease and other diseases of the circulatory system: Secondary | ICD-10-CM | POA: Diagnosis not present

## 2016-02-18 LAB — HEPATITIS PANEL, ACUTE
HEP B C IGM: NEGATIVE
Hep A IgM: NEGATIVE
Hep C Virus Ab: <0.1 s/co ratio (ref 0.0–0.9)
Hepatitis B Surface Ag: NEGATIVE

## 2016-02-18 LAB — HIV ANTIBODY (ROUTINE TESTING W REFLEX): HIV SCREEN 4TH GENERATION: NONREACTIVE

## 2016-02-18 LAB — RPR: RPR: NONREACTIVE

## 2016-02-18 MED ORDER — ESCITALOPRAM OXALATE 20 MG PO TABS: 20.0000 mg | ORAL_TABLET | Freq: Every day | ORAL | 1 refills | Status: DC
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
## 2016-02-19 LAB — GC/CHLAMYDIA PROBE AMP
Chlamydia trachomatis, NAA: NEGATIVE
Neisseria gonorrhoeae by PCR: NEGATIVE

## 2016-02-23 ENCOUNTER — Ambulatory Visit (INDEPENDENT_AMBULATORY_CARE_PROVIDER_SITE_OTHER): Payer: BLUE CROSS/BLUE SHIELD | Admitting: Psychology

## 2016-02-23 DIAGNOSIS — F411 Generalized anxiety disorder: Secondary | ICD-10-CM | POA: Diagnosis not present

## 2016-02-23 NOTE — Progress Notes (Signed)
   THERAPIST PROGRESS NOTE  Session Time: 9.10am -10am  Participation Level: Active  Behavioral Response: Well GroomedAlertAnxious  Type of Therapy: Individual Therapy  Treatment Goals addressed: Diagnosis: GAD and goal 1.  Interventions: CBT  Summary: Henry ChamberlainJason Collins is a 36 y.o. male who presents with affect congruent w/ report of some anxiety and stress related to coping w/ son and managing behavior. Pt reported that this has been the main stressors- his son so hyperactive, impulsive and was sent home from daycare yesterday.  He reports son is being evaluated next week. Pt reports that he has been staying w/ ex to assist w/ parenting and managing behavior.  Pt reported that communication has been good.  Pt aware that needs to find balance for self to give space to move forward and be open to relationships.  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   Suicidal/Homicidal: Nowithout intent/plan  Therapist Response: Assessed pt current functioning per pt report.  Processed w/ pt stressors and ways he is coping and need for self care that allows for relaxation.  Discussed relationship w/ ex and how pt setting boundaries for self to move forward.   Plan: Return again in 2 weeks.  Diagnosis: GAD    Forde RadonYATES,LEANNE, [facility] 02/23/2016

## 2016-03-08 ENCOUNTER — Ambulatory Visit (INDEPENDENT_AMBULATORY_CARE_PROVIDER_SITE_OTHER): Payer: BLUE CROSS/BLUE SHIELD | Admitting: Psychology

## 2016-03-08 DIAGNOSIS — F411 Generalized anxiety disorder: Secondary | ICD-10-CM | POA: Diagnosis not present

## 2016-03-08 NOTE — Progress Notes (Signed)
   THERAPIST PROGRESS NOTE  Session Time: 9.08am-10am  Participation Level: Active  Behavioral Response: Well GroomedAlertaffect WNL.  Type of Therapy: Individual Therapy  Treatment Goals addressed: Diagnosis: GAD and goal 1.  Interventions: CBT and Supportive  Summary: Henry Collins is a 36 y.o. male who presents with generally full and bright affect.  Pt reported that he has been doing well w/ anxiety and not getting irritable. Pt reported that son has still been difficult to manage but coping well w/ this- son did have evaluation and will be getting results back in 2 weeks.  Pt discussed that some potential work stress w/ finding new employee.  Pt also discussed that interactions w/ son's mom has been going ok, but still feels the back and forth re: her decision to either pursue relationship or not.  Pt discussed how is becoming aware of factors that are impacting and that if unable to have commitment from her in near future than best for him to decide to walk away from relationship.  Pt discussed that he doesn't want to be in same position 5 or 10 years from now and negative impact on his wellbeing.   Suicidal/Homicidal: Nowithout intent/plan  Therapist Response: Assessed pt current functioning per pt report.  Processed w/pt interactions w/ son, son's mom and pt mood.  Explored w/pt relationship and wants and whether wants being met.  Encouraged seeking couples counseling if both are receptive.   Plan: Return again in 3 weeks.  Diagnosis: GAD  YATES,LEANNE, [facility] 03/08/2016

## 2016-03-31 ENCOUNTER — Ambulatory Visit (INDEPENDENT_AMBULATORY_CARE_PROVIDER_SITE_OTHER): Payer: BLUE CROSS/BLUE SHIELD | Admitting: Psychiatry

## 2016-03-31 ENCOUNTER — Encounter (HOSPITAL_COMMUNITY): Payer: Self-pay | Admitting: Psychiatry

## 2016-03-31 DIAGNOSIS — Z87891 Personal history of nicotine dependence: Secondary | ICD-10-CM | POA: Diagnosis not present

## 2016-03-31 DIAGNOSIS — Z811 Family history of alcohol abuse and dependence: Secondary | ICD-10-CM | POA: Diagnosis not present

## 2016-03-31 DIAGNOSIS — Z818 Family history of other mental and behavioral disorders: Secondary | ICD-10-CM

## 2016-03-31 DIAGNOSIS — F411 Generalized anxiety disorder: Secondary | ICD-10-CM

## 2016-03-31 MED ORDER — ESCITALOPRAM OXALATE 20 MG PO TABS: 20.0000 mg | ORAL_TABLET | Freq: Every day | ORAL | 2 refills | Status: DC
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
## 2016-04-06 ENCOUNTER — Other Ambulatory Visit (INDEPENDENT_AMBULATORY_CARE_PROVIDER_SITE_OTHER): Payer: Self-pay

## 2016-04-06 MED ORDER — AMOXICILLIN-POT CLAVULANATE 125-31.25 MG/5ML PO SUSR: 875.0000 mg | Freq: Two times a day (BID) | ORAL | 0 refills | Status: AC

## 2016-04-06 MED ORDER — AZITHROMYCIN 250 MG PO TABS: ORAL_TABLET | ORAL | 0 refills | Status: DC

## 2016-04-06 NOTE — Progress Notes (Unsigned)
zpa

## 2016-04-18 ENCOUNTER — Ambulatory Visit (INDEPENDENT_AMBULATORY_CARE_PROVIDER_SITE_OTHER): Payer: Self-pay | Admitting: Psychology

## 2016-04-18 DIAGNOSIS — F411 Generalized anxiety disorder: Secondary | ICD-10-CM

## 2016-04-18 NOTE — Progress Notes (Signed)
   THERAPIST PROGRESS NOTE  Session Time: 10.05am-1.55am  Participation Level: Active  Behavioral Response: Well GroomedAlertIrritable  Type of Therapy: Individual Therapy  Treatment Goals addressed: Diagnosis: GAD and goal 1.  Interventions: CBT and Other: Mindfulness  Summary: Henry Collins is a 36 y.o. male who presents with report of increased irritability.  Pt reports he has noticed quick to shift moods and become irritable quickly at home.  Pt reported that overall anxiety still less- but is concerned about irritability, weight gain and forgetfulness- feels related to medication.  Pt reported that may be impacted by stress of moving locations at work.  Pt reported doesn't feel more stressed w/ this.  Pt reports that major stressor is interactions at home w/ his son and w/ son's mom- although overall interactions improved.  Pt recognizes that when feels overwhelmed will snap as way of pushing others away to give space.  Pt discussed that did this w/ childhood and modeled by mom as well.   Pt mildly receptive to mindfulness practice.    Suicidal/Homicidal: Nowithout intent/plan  Therapist Response: Assessed pt current functioning per pt report.  Processed w/pt report of increased irritability and potential contributing factors.  Encouraged pt coping w/ use of acknowledging and mindfulness practice to give opportunity to problem solve/conflict resolution skills of assertiveness.  Disucssed pattern developed at young age and how served in past but not currently.    Plan: Return again in 2 weeks. 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   Diagnosis: GAD    Kalsey Lull, [facility] 04/18/2016

## 2016-05-26 ENCOUNTER — Ambulatory Visit (INDEPENDENT_AMBULATORY_CARE_PROVIDER_SITE_OTHER): Payer: BLUE CROSS/BLUE SHIELD | Admitting: Psychiatry

## 2016-05-26 ENCOUNTER — Encounter (HOSPITAL_COMMUNITY): Payer: Self-pay | Admitting: Psychiatry

## 2016-05-26 DIAGNOSIS — Z87891 Personal history of nicotine dependence: Secondary | ICD-10-CM

## 2016-05-26 DIAGNOSIS — Z79899 Other long term (current) drug therapy: Secondary | ICD-10-CM | POA: Diagnosis not present

## 2016-05-26 DIAGNOSIS — F39 Unspecified mood [affective] disorder: Secondary | ICD-10-CM | POA: Diagnosis not present

## 2016-05-26 DIAGNOSIS — Z811 Family history of alcohol abuse and dependence: Secondary | ICD-10-CM | POA: Diagnosis not present

## 2016-05-26 DIAGNOSIS — F411 Generalized anxiety disorder: Secondary | ICD-10-CM

## 2016-05-26 DIAGNOSIS — Z818 Family history of other mental and behavioral disorders: Secondary | ICD-10-CM | POA: Diagnosis not present

## 2016-05-26 MED ORDER — LAMOTRIGINE 25 MG PO TABS: ORAL_TABLET | ORAL | 1 refills | Status: DC

## 2016-05-26 MED ORDER — ESCITALOPRAM OXALATE 20 MG PO TABS: 20.0000 mg | ORAL_TABLET | Freq: Every day | ORAL | 0 refills | Status: DC
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
## 2016-05-30 ENCOUNTER — Ambulatory Visit (HOSPITAL_COMMUNITY): Payer: Self-pay | Admitting: Psychology

## 2016-05-31 ENCOUNTER — Ambulatory Visit (INDEPENDENT_AMBULATORY_CARE_PROVIDER_SITE_OTHER): Payer: BLUE CROSS/BLUE SHIELD | Admitting: Psychology

## 2016-05-31 DIAGNOSIS — F411 Generalized anxiety disorder: Secondary | ICD-10-CM

## 2016-05-31 NOTE — Progress Notes (Signed)
   THERAPIST PROGRESS NOTE  Session Time: 10am-11am  Participation Level: Active  Behavioral Response: Well GroomedAlertaffect wnl  Type of Therapy: Individual Therapy  Treatment Goals addressed: Diagnosis: GAD and goal 1.  Interventions: CBT and Supportive  Summary: Henry ChamberlainJason Collins is a 36 y.o. male who presents with affect wnl.  Pt reports taking new med for couple days now- feels a little more drowsy on- waiting to see effect. Pt reports that he deals w/ periods of irritability but recognizing usually related to interactions w/ his ex.  Pt reports he is trying to distance more for himself as he was willing to try to work things out but she again is acting like doesn't care.  Pt feels that she is more engaged w/ holidays and then when sees him moving forward in another relationship.  Pt discussed that wants to show son different way of interacting and that why he is focusing on improving self.  Pt reports that also dealing w/ stressor of sister in laws plans to have his mom move in with them and that brother isn't being upfront w/ him about as knows disagrees.  Pt discussed that he will continue to keep these boundaries for self care.   Suicidal/Homicidal: Nowithout intent/plan  Therapist Response: Assessed pt current functioning per pt report.  Explored w/pt interactions w/ son and w/ his ex.  Discussed how boundaries not clear and how to keep clear boundaries for self care.  Encouraged to seek couples counseling to move forward.    Plan: Return again in 4 weeks as pt is unable to schedule sooner w/ work schedule  Diagnosis: GAD    Forde RadonYATES,Nakai Pollio, [facility] 05/31/2016

## 2016-07-01 ENCOUNTER — Encounter (HOSPITAL_COMMUNITY): Payer: Self-pay | Admitting: Psychiatry

## 2016-07-01 ENCOUNTER — Ambulatory Visit (INDEPENDENT_AMBULATORY_CARE_PROVIDER_SITE_OTHER): Payer: BLUE CROSS/BLUE SHIELD | Admitting: Psychiatry

## 2016-07-01 DIAGNOSIS — F39 Unspecified mood [affective] disorder: Secondary | ICD-10-CM

## 2016-07-01 DIAGNOSIS — Z818 Family history of other mental and behavioral disorders: Secondary | ICD-10-CM | POA: Diagnosis not present

## 2016-07-01 DIAGNOSIS — Z87891 Personal history of nicotine dependence: Secondary | ICD-10-CM

## 2016-07-01 DIAGNOSIS — F411 Generalized anxiety disorder: Secondary | ICD-10-CM

## 2016-07-01 DIAGNOSIS — Z811 Family history of alcohol abuse and dependence: Secondary | ICD-10-CM | POA: Diagnosis not present

## 2016-07-01 MED ORDER — ESCITALOPRAM OXALATE 20 MG PO TABS: 20.0000 mg | ORAL_TABLET | Freq: Every day | ORAL | 0 refills | Status: DC

## 2016-07-01 MED ORDER — LAMOTRIGINE 25 MG PO TABS: 50.0000 mg | ORAL_TABLET | Freq: Every day | ORAL | 2 refills | Status: DC
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
## 2016-08-02 ENCOUNTER — Ambulatory Visit (INDEPENDENT_AMBULATORY_CARE_PROVIDER_SITE_OTHER): Payer: BLUE CROSS/BLUE SHIELD | Admitting: Psychology

## 2016-08-02 DIAGNOSIS — F411 Generalized anxiety disorder: Secondary | ICD-10-CM

## 2016-08-02 NOTE — Progress Notes (Signed)
   THERAPIST PROGRESS NOTE  Session Time: 9.08am-9.52am  Participation Level: Active  Behavioral Response: Well GroomedAlertaffect wnl  Type of Therapy: Individual Therapy  Treatment Goals addressed: Diagnosis: GAD and goal 1.  Interventions: CBT and Supportive  Summary: Henry ChamberlainJason Collins is a 36 y.o. male who presents with affect full and bright. Pt reported overall his mood has been good- less snappy, able to let things go, not ruminating.  Pt is still complaining about his eyes- blurry, itchy and will f/u w/ opthamologist re:Marland Kitchen. Pt reported that his is recognized that a lot of his irritability is situational w/ his ex and struggles w/son's behavior when she is present.  Pt discussed that he is moving forward w/out continuing relationship w/ mom and separating interactions w/ son from his mother.  Pt reports that he is focusing on his goals of buying a home- preparing for that and keeping boundaries w/ ex as he continues this..   Suicidal/Homicidal: Nowithout intent/plan  Therapist Response: Assessed pt current functioning per pt report.  Processed w/pt coping w/ interactions w/ his ex.  Explored mood and irritability situational.  Discussed importance of continuing good boundaries.    Plan: Return again in 4 weeks.  Diagnosis: GAD    YATES,LEANNE, [facility] 08/02/2016

## 2016-09-20 ENCOUNTER — Ambulatory Visit (INDEPENDENT_AMBULATORY_CARE_PROVIDER_SITE_OTHER): Payer: BLUE CROSS/BLUE SHIELD | Admitting: Psychiatry

## 2016-09-20 ENCOUNTER — Encounter (HOSPITAL_COMMUNITY): Payer: Self-pay | Admitting: Psychiatry

## 2016-09-20 DIAGNOSIS — Z811 Family history of alcohol abuse and dependence: Secondary | ICD-10-CM

## 2016-09-20 DIAGNOSIS — F411 Generalized anxiety disorder: Secondary | ICD-10-CM | POA: Diagnosis not present

## 2016-09-20 DIAGNOSIS — F39 Unspecified mood [affective] disorder: Secondary | ICD-10-CM | POA: Diagnosis not present

## 2016-09-20 DIAGNOSIS — Z818 Family history of other mental and behavioral disorders: Secondary | ICD-10-CM

## 2016-09-20 DIAGNOSIS — K219 Gastro-esophageal reflux disease without esophagitis: Secondary | ICD-10-CM

## 2016-09-20 DIAGNOSIS — Z87891 Personal history of nicotine dependence: Secondary | ICD-10-CM | POA: Diagnosis not present

## 2016-09-20 MED ORDER — ESCITALOPRAM OXALATE 20 MG PO TABS: 20.0000 mg | ORAL_TABLET | Freq: Every day | ORAL | 0 refills | Status: DC

## 2016-09-20 MED ORDER — LAMOTRIGINE 25 MG PO TABS: 50.0000 mg | ORAL_TABLET | Freq: Every day | ORAL | 0 refills | Status: DC
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
## 2016-12-19 ENCOUNTER — Other Ambulatory Visit (HOSPITAL_COMMUNITY): Payer: Self-pay | Admitting: Psychiatry

## 2016-12-19 DIAGNOSIS — F39 Unspecified mood [affective] disorder: Secondary | ICD-10-CM

## 2016-12-21 ENCOUNTER — Encounter (HOSPITAL_COMMUNITY): Payer: Self-pay | Admitting: Psychiatry

## 2016-12-21 ENCOUNTER — Ambulatory Visit (HOSPITAL_COMMUNITY): Payer: BLUE CROSS/BLUE SHIELD | Admitting: Psychiatry

## 2016-12-21 VITALS — BP 116/74 | HR 81 | Ht 73.0 in | Wt 208.0 lb

## 2016-12-21 DIAGNOSIS — Z87891 Personal history of nicotine dependence: Secondary | ICD-10-CM | POA: Diagnosis not present

## 2016-12-21 DIAGNOSIS — R454 Irritability and anger: Secondary | ICD-10-CM | POA: Diagnosis not present

## 2016-12-21 DIAGNOSIS — F39 Unspecified mood [affective] disorder: Secondary | ICD-10-CM

## 2016-12-21 DIAGNOSIS — Z818 Family history of other mental and behavioral disorders: Secondary | ICD-10-CM | POA: Diagnosis not present

## 2016-12-21 DIAGNOSIS — F411 Generalized anxiety disorder: Secondary | ICD-10-CM

## 2016-12-21 MED ORDER — ESCITALOPRAM OXALATE 20 MG PO TABS: 20.0000 mg | ORAL_TABLET | Freq: Every day | ORAL | 0 refills | Status: DC

## 2016-12-21 MED ORDER — LAMOTRIGINE 25 MG PO TABS: ORAL_TABLET | ORAL | 1 refills | Status: DC
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
## 2017-01-02 ENCOUNTER — Other Ambulatory Visit (HOSPITAL_COMMUNITY): Payer: Self-pay

## 2017-01-02 DIAGNOSIS — F39 Unspecified mood [affective] disorder: Secondary | ICD-10-CM

## 2017-01-02 MED ORDER — LAMOTRIGINE 25 MG PO TABS: ORAL_TABLET | ORAL | 0 refills | Status: DC

## 2017-01-11 NOTE — Progress Notes (Signed)
Subjective:    Patient ID: Henry Collins, male    DOB: 09-18-1980, 37 y.o.   MRN: [id number]  HPI:  Henry Collins is here to establish as a new pt.  He is a pleasant 37 year old male.  WUJ:WJXB and anxiety-followed by psychiatrist and treated with escitalopram 20mg .  He has long-standing hx of severe anxiety and been trialed on other medications, however he cannot recall name.  He has not had regular medical care in years and is concerned about 30 lb wt gain since stating mental health medications 12 months ago. He has a 24 year old son, "Madelin Rear", runs a [employer] and has a strained relationship with his son's mother. His job is strenuous, however he denies formal exercise. He drinks 40 of water day and feels like he is eating less than a year ago, but continues to gain wt-? He denies tobacco/ETOH use. He see's psychiatrist once every 3 months, next OV 02/01/17 and therapist every month. He reports blurred vision with inner eye d/c that has steadily increased the last few months. During OV he constantly was touching his clothing then his eyes- discussed that he is introducing bacteria into his eyes and needs to try to stop  Patient Care Team    Relationship Specialty Notifications Start End  Julaine Fusi, NP PCP - General Family Medicine  01/12/17     Patient Active Problem List   Diagnosis Date Noted  . Healthcare maintenance 01/12/2017  . Blurred vision 01/12/2017  . Anxiety 01/12/2017  . HEMORRHOIDS, INTERNAL 09/24/2009  . GERD 09/24/2009  . RECTAL BLEEDING 09/24/2009     Past Medical History:  Diagnosis Date  . Allergy   . Anxiety   . Arthritis   . GERD (gastroesophageal reflux disease)      Past Surgical History:  Procedure Laterality Date  . ARTERIAL THROMBECTOMY Right    Accident at work  . WRIST SURGERY Right      Family History  Problem Relation Age of Onset  . Depression Mother   . Anxiety disorder Mother   . Alcohol abuse Mother   . Breast  cancer Mother   . Diabetes Maternal Grandfather   . Breast cancer Maternal Grandmother   . Hypertension Paternal Grandfather   . Hypertension Father      Social History   Substance and Sexual Activity  Drug Use No     Social History   Substance and Sexual Activity  Alcohol Use No     Social History   Tobacco Use  Smoking Status Former Smoker  . Packs/day: 1.00  . Years: 13.00  . Pack years: 13.00  . Types: Cigarettes  . Last attempt to quit: 06/30/2008  . Years since quitting: 8.5  Smokeless Tobacco Never Used     Outpatient Encounter Medications as of 01/12/2017  Medication Sig  . escitalopram (LEXAPRO) 20 MG tablet Take 1 tablet (20 mg total) by mouth daily.  Marland Kitchen lamoTRIgine (LAMICTAL) 25 MG tablet Take 1 tab daily in am and 2 tab at bed time (Patient taking differently: Take 75 mg by mouth daily. Take 1 tab daily in am and 2 tab at bed time)  . NEXIUM 40 MG capsule TAKE ONE CAPSULE BY MOUTH 30 MINUTES PRIOR TO BREAKFAST   No facility-administered encounter medications on file as of 01/12/2017.     Allergies: Patient has no known allergies.  Body mass index is 28.56 kg/m.  Blood pressure 124/74, pulse 75, height 6' (1.829 m), weight 210  lb 9.6 oz (95.5 kg), SpO2 98 %.      Review of Systems  Constitutional: Positive for activity change and fatigue. Negative for appetite change, chills, diaphoresis, fever and unexpected weight change.  HENT: Negative for congestion.   Eyes: Positive for visual disturbance.  Respiratory: Negative for cough, chest tightness, shortness of breath, wheezing and stridor.   Cardiovascular: Negative for chest pain, palpitations and leg swelling.  Genitourinary: Negative for difficulty urinating and flank pain.  Skin: Negative for color change, pallor, rash and wound.  Neurological: Negative for dizziness and headaches.  Hematological: Does not bruise/bleed easily.  Psychiatric/Behavioral: Positive for sleep disturbance. Negative  for decreased concentration, dysphoric mood, hallucinations, self-injury and suicidal ideas. The patient is nervous/anxious and is hyperactive.        Objective:   Physical Exam  Constitutional: He is oriented to person, place, and time. He appears well-developed and well-nourished. No distress.  HENT:  Head: Normocephalic and atraumatic.  Right Ear: External ear normal.  Left Ear: External ear normal.  Cardiovascular: Normal rate, regular rhythm, normal heart sounds and intact distal pulses.  No murmur heard. Pulmonary/Chest: Effort normal and breath sounds normal. No respiratory distress. He has no wheezes. He has no rales. He exhibits no tenderness.  Neurological: He is alert and oriented to person, place, and time.  Skin: Skin is warm and dry. No rash noted. No erythema. No pallor.  Psychiatric: He has a normal mood and affect. His behavior is normal. Judgment and thought content normal.  Nursing note and vitals reviewed.         Assessment & Plan:   1. Healthcare maintenance   2. Blurred vision   3. Anxiety     Healthcare maintenance  Increase water intake, strive for at least 110 ounces/day.   Follow Heart Healthy diet Increase regular exercise.  Recommend at least 30 minutes daily, 5 days per week of walking, jogging, biking, swimming, YouTube/Pinterest workout videos. Continue all medications as directed and continue regular follow-up with mental health providers. Please schedule complete physical with fasting labs in 4 weeks.  Anxiety Continue escitalopram 20mg  once daily and lamotrigine 25mg - he reports taking 3 tabs each morning. Advised to discuss SE with psychiatrist and encouraged to increased regular exercise to hep with wt gain and reduce anxiety.    FOLLOW-UP:  Return in about 4 weeks (around 02/09/2017) for Fasting Labs, CPE.

## 2017-01-12 ENCOUNTER — Ambulatory Visit: Payer: BLUE CROSS/BLUE SHIELD | Admitting: Adult Health

## 2017-01-12 ENCOUNTER — Encounter: Payer: Self-pay | Admitting: Adult Health

## 2017-01-12 ENCOUNTER — Other Ambulatory Visit: Payer: Self-pay | Admitting: Adult Health

## 2017-01-12 VITALS — BP 124/74 | HR 75 | Ht 72.0 in | Wt 210.6 lb

## 2017-01-12 DIAGNOSIS — F419 Anxiety disorder, unspecified: Secondary | ICD-10-CM

## 2017-01-12 DIAGNOSIS — H538 Other visual disturbances: Secondary | ICD-10-CM

## 2017-01-12 DIAGNOSIS — Z0000 Encounter for general adult medical examination without abnormal findings: Secondary | ICD-10-CM | POA: Insufficient documentation

## 2017-01-12 NOTE — Assessment & Plan Note (Signed)
Continue escitalopram 20mg  once daily and lamotrigine 25mg - he reports taking 3 tabs each morning. Advised to discuss SE with psychiatrist and encouraged to increased regular exercise to hep with wt gain and reduce anxiety.

## 2017-01-12 NOTE — Patient Instructions (Signed)
Heart-Healthy Eating Plan Many factors influence your heart health, including eating and exercise habits. Heart (coronary) risk increases with abnormal blood fat (lipid) levels. Heart-healthy meal planning includes limiting unhealthy fats, increasing healthy fats, and making other small dietary changes. This includes maintaining a healthy body weight to help keep lipid levels within a normal range. What is my plan? Your health care provider recommends that you:  Get no more than ___25__% of the total calories in your daily diet from fat.  Limit your intake of saturated fat to less than ___5___% of your total calories each day.  Limit the amount of cholesterol in your diet to less than __300__ mg per day.  What types of fat should I choose?  Choose healthy fats more often. Choose monounsaturated and polyunsaturated fats, such as olive oil and canola oil, flaxseeds, walnuts, almonds, and seeds.  Eat more omega-3 fats. Good choices include salmon, mackerel, sardines, tuna, flaxseed oil, and ground flaxseeds. Aim to eat fish at least two times each week.  Limit saturated fats. Saturated fats are primarily found in animal products, such as meats, butter, and cream. Plant sources of saturated fats include palm oil, palm kernel oil, and coconut oil.  Avoid foods with partially hydrogenated oils in them. These contain trans fats. Examples of foods that contain trans fats are stick margarine, some tub margarines, cookies, crackers, and other baked goods. What general guidelines do I need to follow?  Check food labels carefully to identify foods with trans fats or high amounts of saturated fat.  Fill one half of your plate with vegetables and green salads. Eat 4-5 servings of vegetables per day. A serving of vegetables equals 1 cup of raw leafy vegetables,  cup of raw or cooked cut-up vegetables, or  cup of vegetable juice.  Fill one fourth of your plate with whole grains. Look for the word "whole"  as the first word in the ingredient list.  Fill one fourth of your plate with lean protein foods.  Eat 4-5 servings of fruit per day. A serving of fruit equals one medium whole fruit,  cup of dried fruit,  cup of fresh, frozen, or canned fruit, or  cup of 100% fruit juice.  Eat more foods that contain soluble fiber. Examples of foods that contain this type of fiber are apples, broccoli, carrots, beans, peas, and barley. Aim to get 20-30 g of fiber per day.  Eat more home-cooked food and less restaurant, buffet, and fast food.  Limit or avoid alcohol.  Limit foods that are high in starch and sugar.  Avoid fried foods.  Cook foods by using methods other than frying. Baking, boiling, grilling, and broiling are all great options. Other fat-reducing suggestions include: ? Removing the skin from poultry. ? Removing all visible fats from meats. ? Skimming the fat off of stews, soups, and gravies before serving them. ? Steaming vegetables in water or broth.  Lose weight if you are overweight. Losing just 5-10% of your initial body weight can help your overall health and prevent diseases such as diabetes and heart disease.  Increase your consumption of nuts, legumes, and seeds to 4-5 servings per week. One serving of dried beans or legumes equals  cup after being cooked, one serving of nuts equals 1 ounces, and one serving of seeds equals  ounce or 1 tablespoon.  You may need to monitor your salt (sodium) intake, especially if you have high blood pressure. Talk with your health care provider or dietitian to get  more information about reducing sodium. What foods can I eat? Grains  Breads, including [place], white, pita, wheat, raisin, rye, oatmeal, and [place]. Tortillas that are neither fried nor made with lard or trans fat. Low-fat rolls, including hotdog and hamburger buns and English muffins. Biscuits. Muffins. Waffles. Pancakes. Light popcorn. Whole-grain cereals. Flatbread. Melba  toast. Pretzels. Breadsticks. Rusks. Low-fat snacks and crackers, including oyster, saltine, matzo, graham, animal, and rye. Rice and pasta, including brown rice and those that are made with whole wheat. Vegetables All vegetables. Fruits All fruits, but limit coconut. Meats and Other Protein Sources Lean, well-trimmed beef, veal, pork, and lamb. Chicken and [place] without skin. All fish and shellfish. Wild duck, rabbit, pheasant, and venison. Egg whites or low-cholesterol egg substitutes. Dried beans, peas, lentils, and tofu.Seeds and most nuts. Dairy Low-fat or nonfat cheeses, including ricotta, string, and mozzarella. Skim or 1% milk that is liquid, powdered, or evaporated. Buttermilk that is made with low-fat milk. Nonfat or low-fat yogurt. Beverages Mineral water. Diet carbonated beverages. Sweets and Desserts Sherbets and fruit ices. Honey, jam, marmalade, jelly, and syrups. Meringues and gelatins. Pure sugar candy, such as hard candy, jelly beans, gumdrops, mints, marshmallows, and small amounts of dark chocolate. [employer] Eat all sweets and desserts in moderation. Fats and Oils Nonhydrogenated (trans-free) margarines. Vegetable oils, including soybean, sesame, sunflower, olive, peanut, safflower, corn, canola, and cottonseed. Salad dressings or mayonnaise that are made with a vegetable oil. Limit added fats and oils that you use for cooking, baking, salads, and as spreads. Other Cocoa powder. Coffee and tea. All seasonings and condiments. The items listed above may not be a complete list of recommended foods or beverages. Contact your dietitian for more options. What foods are not recommended? Grains Breads that are made with saturated or trans fats, oils, or whole milk. Croissants. Butter rolls. Cheese breads. Sweet rolls. Donuts. Buttered popcorn. Chow mein noodles. High-fat crackers, such as cheese or butter crackers. Meats and Other Protein Sources Fatty meats, such as  hotdogs, short ribs, sausage, spareribs, bacon, ribeye roast or steak, and mutton. High-fat deli meats, such as salami and bologna. Caviar. Domestic duck and goose. Organ meats, such as kidney, liver, sweetbreads, brains, gizzard, chitterlings, and heart. Dairy Cream, sour cream, cream cheese, and creamed cottage cheese. Whole milk cheeses, including blue (bleu), Monterey Jack, [place], [place], American, [place], [place], [place], [place], and [place]. Whole or 2% milk that is liquid, evaporated, or condensed. Whole buttermilk. Cream sauce or high-fat cheese sauce. Yogurt that is made from whole milk. Beverages Regular sodas and drinks with added sugar. Sweets and Desserts Frosting. Pudding. Cookies. Cakes other than angel food cake. Candy that has milk chocolate or white chocolate, hydrogenated fat, butter, coconut, or unknown ingredients. Buttered syrups. Full-fat ice cream or ice cream drinks. Fats and Oils Gravy that has suet, meat fat, or shortening. Cocoa butter, hydrogenated oils, palm oil, coconut oil, palm kernel oil. These can often be found in baked products, candy, fried foods, nondairy creamers, and whipped toppings. Solid fats and shortenings, including bacon fat, salt pork, lard, and butter. Nondairy cream substitutes, such as coffee creamers and sour cream substitutes. Salad dressings that are made of unknown oils, cheese, or sour cream. The items listed above may not be a complete list of foods and beverages to avoid. Contact your dietitian for more information. This information is not intended to replace advice given to you by your health care provider. Make sure you discuss any questions you have with your health care  provider. Document Released: 10/06/2007 Document Revised: 07/17/2015 Document Reviewed: 06/20/2013 Elsevier Interactive Patient Education  2018 Elsevier Inc.   Generalized Anxiety Disorder, Adult Generalized anxiety disorder (GAD) is a mental health disorder. People  with this condition constantly worry about everyday events. Unlike normal anxiety, worry related to GAD is not triggered by a specific event. These worries also do not fade or get better with time. GAD interferes with life functions, including relationships, work, and school. GAD can vary from mild to severe. People with severe GAD can have intense waves of anxiety with physical symptoms (panic attacks). What are the causes? The exact cause of GAD is not known. What increases the risk? This condition is more likely to develop in:  Women.  People who have a family history of anxiety disorders.  People who are very shy.  People who experience very stressful life events, such as the death of a loved one.  People who have a very stressful family environment.  What are the signs or symptoms? People with GAD often worry excessively about many things in their lives, such as their health and family. They may also be overly concerned about:  Doing well at work.  Being on time.  Natural disasters.  Friendships.  Physical symptoms of GAD include:  Fatigue.  Muscle tension or having muscle twitches.  Trembling or feeling shaky.  Being easily startled.  Feeling like your heart is pounding or racing.  Feeling out of breath or like you cannot take a deep breath.  Having trouble falling asleep or staying asleep.  Sweating.  Nausea, diarrhea, or irritable bowel syndrome (IBS).  Headaches.  Trouble concentrating or remembering facts.  Restlessness.  Irritability.  How is this diagnosed? Your health care provider can diagnose GAD based on your symptoms and medical history. You will also have a physical exam. The health care provider will ask specific questions about your symptoms, including how severe they are, when they started, and if they come and go. Your health care provider may ask you about your use of alcohol or drugs, including prescription medicines. Your health care  provider may refer you to a mental health specialist for further evaluation. Your health care provider will do a thorough examination and may perform additional tests to rule out other possible causes of your symptoms. To be diagnosed with GAD, a person must have anxiety that:  Is out of his or her control.  Affects several different aspects of his or her life, such as work and relationships.  Causes distress that makes him or her unable to take part in normal activities.  Includes at least three physical symptoms of GAD, such as restlessness, fatigue, trouble concentrating, irritability, muscle tension, or sleep problems.  Before your health care provider can confirm a diagnosis of GAD, these symptoms must be present more days than they are not, and they must last for six months or longer. How is this treated? The following therapies are usually used to treat GAD:  Medicine. Antidepressant medicine is usually prescribed for long-term daily control. Antianxiety medicines may be added in severe cases, especially when panic attacks occur.  Talk therapy (psychotherapy). Certain types of talk therapy can be helpful in treating GAD by providing support, education, and guidance. Options include: ? Cognitive behavioral therapy (CBT). People learn coping skills and techniques to ease their anxiety. They learn to identify unrealistic or negative thoughts and behaviors and to replace them with positive ones. ? Acceptance and commitment therapy (ACT). This treatment teaches people  how to be mindful as a way to cope with unwanted thoughts and feelings. ? Biofeedback. This process trains you to manage your body's response (physiological response) through breathing techniques and relaxation methods. You will work with a therapist while machines are used to monitor your physical symptoms.  Stress management techniques. These include yoga, meditation, and exercise.  A mental health specialist can help  determine which treatment is best for you. Some people see improvement with one type of therapy. However, other people require a combination of therapies. Follow these instructions at home:  Take over-the-counter and prescription medicines only as told by your health care provider.  Try to maintain a normal routine.  Try to anticipate stressful situations and allow extra time to manage them.  Practice any stress management or self-calming techniques as taught by your health care provider.  Do not punish yourself for setbacks or for not making progress.  Try to recognize your accomplishments, even if they are small.  Keep all follow-up visits as told by your health care provider. This is important. Contact a health care provider if:  Your symptoms do not get better.  Your symptoms get worse.  You have signs of depression, such as: ? A persistently sad, cranky, or irritable mood. ? Loss of enjoyment in activities that used to bring you joy. ? Change in weight or eating. ? Changes in sleeping habits. ? Avoiding friends or family members. ? Loss of energy for normal tasks. ? Feelings of guilt or worthlessness. Get help right away if:  You have serious thoughts about hurting yourself or others. If you ever feel like you may hurt yourself or others, or have thoughts about taking your own life, get help right away. You can go to your nearest emergency department or call:  Your local emergency services (911 in the [place]  A suicide crisis helpline, such as the National Suicide Prevention Lifeline at 77244852631-(920)818-1859. This is open 24 hours a day.  Summary  Generalized anxiety disorder (GAD) is a mental health disorder that involves worry that is not triggered by a specific event.  People with GAD often worry excessively about many things in their lives, such as their health and family.  GAD may cause physical symptoms such as restlessness, trouble concentrating, sleep problems,  frequent sweating, nausea, diarrhea, headaches, and trembling or muscle twitching.  A mental health specialist can help determine which treatment is best for you. Some people see improvement with one type of therapy. However, other people require a combination of therapies. This information is not intended to replace advice given to you by your health care provider. Make sure you discuss any questions you have with your health care provider. Document Released: 04/23/2012 Document Revised: 11/17/2015 Document Reviewed: 11/17/2015 Elsevier Interactive Patient Education  2018 [employer]   Increase water intake, strive for at least 110 ounces/day.   Follow Heart Healthy diet Increase regular exercise.  Recommend at least 30 minutes daily, 5 days per week of walking, jogging, biking, swimming, YouTube/Pinterest workout videos. Continue all medications as directed and continue regular follow-up with mental health providers. Please schedule complete physical with fasting labs in 4 weeks. WELCOME TO THE PRACTICE!

## 2017-01-12 NOTE — Assessment & Plan Note (Signed)
  Increase water intake, strive for at least 110 ounces/day.   Follow Heart Healthy diet Increase regular exercise.  Recommend at least 30 minutes daily, 5 days per week of walking, jogging, biking, swimming, YouTube/Pinterest workout videos. Continue all medications as directed and continue regular follow-up with mental health providers. Please schedule complete physical with fasting labs in 4 weeks.

## 2017-01-18 ENCOUNTER — Encounter: Payer: Self-pay | Admitting: Adult Health

## 2017-02-01 ENCOUNTER — Ambulatory Visit (HOSPITAL_COMMUNITY): Payer: BLUE CROSS/BLUE SHIELD | Admitting: Psychiatry

## 2017-02-01 ENCOUNTER — Encounter (HOSPITAL_COMMUNITY): Payer: Self-pay | Admitting: Psychiatry

## 2017-02-01 DIAGNOSIS — F39 Unspecified mood [affective] disorder: Secondary | ICD-10-CM | POA: Diagnosis not present

## 2017-02-01 DIAGNOSIS — F411 Generalized anxiety disorder: Secondary | ICD-10-CM | POA: Diagnosis not present

## 2017-02-01 MED ORDER — LAMOTRIGINE 100 MG PO TABS: 100.0000 mg | ORAL_TABLET | Freq: Every day | ORAL | 0 refills | Status: DC

## 2017-02-01 MED ORDER — ESCITALOPRAM OXALATE 20 MG PO TABS: 20.0000 mg | ORAL_TABLET | Freq: Every day | ORAL | 0 refills | Status: DC
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
## 2017-02-09 ENCOUNTER — Other Ambulatory Visit (INDEPENDENT_AMBULATORY_CARE_PROVIDER_SITE_OTHER): Payer: BLUE CROSS/BLUE SHIELD

## 2017-02-09 DIAGNOSIS — H538 Other visual disturbances: Secondary | ICD-10-CM | POA: Diagnosis not present

## 2017-02-09 DIAGNOSIS — Z0000 Encounter for general adult medical examination without abnormal findings: Secondary | ICD-10-CM | POA: Diagnosis not present

## 2017-02-09 DIAGNOSIS — K219 Gastro-esophageal reflux disease without esophagitis: Secondary | ICD-10-CM | POA: Diagnosis not present

## 2017-02-09 DIAGNOSIS — K625 Hemorrhage of anus and rectum: Secondary | ICD-10-CM

## 2017-02-09 DIAGNOSIS — F419 Anxiety disorder, unspecified: Secondary | ICD-10-CM

## 2017-02-10 LAB — COMPREHENSIVE METABOLIC PANEL
ALK PHOS: 78 IU/L (ref 39–117)
ALT: 51 IU/L — AB (ref 0–44)
AST: 31 IU/L (ref 0–40)
Albumin/Globulin Ratio: 1.8 (ref 1.2–2.2)
Albumin: 4.6 g/dL (ref 3.5–5.5)
BILIRUBIN TOTAL: 0.4 mg/dL (ref 0.0–1.2)
BUN/Creatinine Ratio: 13 (ref 9–20)
BUN: 16 mg/dL (ref 6–20)
CHLORIDE: 103 mmol/L (ref 96–106)
CO2: 24 mmol/L (ref 20–29)
Calcium: 9.5 mg/dL (ref 8.7–10.2)
Creatinine, Ser: 1.21 mg/dL (ref 0.76–1.27)
GFR calc Af Amer: 87 mL/min/{1.73_m2} (ref >59)
GFR calc non Af Amer: 75 mL/min/{1.73_m2} (ref >59)
GLUCOSE: 100 mg/dL — AB (ref 65–99)
Globulin, Total: 2.6 g/dL (ref 1.5–4.5)
POTASSIUM: 4.4 mmol/L (ref 3.5–5.2)
Sodium: 140 mmol/L (ref 134–144)
TOTAL PROTEIN: 7.2 g/dL (ref 6.0–8.5)

## 2017-02-10 LAB — CBC WITH DIFFERENTIAL/PLATELET
BASOS ABS: 0 10*3/uL (ref 0.0–0.2)
Basos: 0 %
EOS (ABSOLUTE): 0.1 10*3/uL (ref 0.0–0.4)
Eos: 1 %
HEMOGLOBIN: 14.7 g/dL (ref 13.0–17.7)
Hematocrit: 43.7 % (ref 37.5–51.0)
IMMATURE GRANS (ABS): 0 10*3/uL (ref 0.0–0.1)
Immature Granulocytes: 0 %
LYMPHS: 37 %
Lymphocytes Absolute: 2.1 10*3/uL (ref 0.7–3.1)
MCH: 28.7 pg (ref 26.6–33.0)
MCHC: 33.6 g/dL (ref 31.5–35.7)
MCV: 85 fL (ref 79–97)
MONOCYTES: 7 %
Monocytes Absolute: 0.4 10*3/uL (ref 0.1–0.9)
NEUTROS ABS: 3.1 10*3/uL (ref 1.4–7.0)
Neutrophils: 55 %
PLATELETS: 264 10*3/uL (ref 150–379)
RBC: 5.12 x10E6/uL (ref 4.14–5.80)
RDW: 14 % (ref 12.3–15.4)
WBC: 5.7 10*3/uL (ref 3.4–10.8)

## 2017-02-10 LAB — LIPID PANEL
CHOLESTEROL TOTAL: 285 mg/dL — AB (ref 100–199)
Chol/HDL Ratio: 9.8 ratio — ABNORMAL HIGH (ref 0.0–5.0)
HDL: 29 mg/dL — ABNORMAL LOW (ref >39)
LDL Calculated: 202 mg/dL — ABNORMAL HIGH (ref 0–99)
TRIGLYCERIDES: 270 mg/dL — AB (ref 0–149)
VLDL Cholesterol Cal: 54 mg/dL — ABNORMAL HIGH (ref 5–40)

## 2017-02-10 LAB — HEMOGLOBIN A1C
ESTIMATED AVERAGE GLUCOSE: 111 mg/dL
HEMOGLOBIN A1C: 5.5 % (ref 4.8–5.6)

## 2017-02-10 LAB — VITAMIN D 25 HYDROXY (VIT D DEFICIENCY, FRACTURES): VIT D 25 HYDROXY: 24.8 ng/mL — AB (ref 30.0–100.0)

## 2017-02-10 LAB — TSH: TSH: 1.48 u[IU]/mL (ref 0.450–4.500)

## 2017-02-14 ENCOUNTER — Telehealth (INDEPENDENT_AMBULATORY_CARE_PROVIDER_SITE_OTHER): Payer: Self-pay | Admitting: Orthopaedic Surgery

## 2017-02-14 NOTE — Telephone Encounter (Signed)
[facility] if [facility] had [facility]. I checked and had coworker look with me, there is no record of Tdap. I advised Henry Collins

## 2017-02-15 NOTE — Progress Notes (Signed)
Subjective:    Patient ID: Henry Collins, male    DOB: July 19, 1980, 37 y.o.   MRN: [id number]  HPI: 01/12/17 OV:  Henry Collins is here to establish as a new pt.  He is a pleasant 37 year old male.  NWG:NFAOPMH:GERD and anxiety-followed by psychiatrist and treated with escitalopram 20mg .  He has long-standing hx of severe anxiety and been trialed on other medications, however he cannot recall name.  He has not had regular medical care in years and is concerned about 30 lb wt gain since stating mental health medications 12 months ago. He has a 34[age] year old son, "Henry Collins", runs a [employer] company and has a strained relationship with his son's mother. His job is strenuous, however he denies formal exercise. He drinks 40 of water day and feels like he is eating less than a year ago, but continues to gain wt-? He denies tobacco/ETOH use. He see's psychiatrist once every 3 months, next OV 02/01/17 and therapist every month. He reports blurred vision with inner eye d/c that has steadily increased the last few months. During OV he constantly was touching his clothing then his eyes- discussed that he is introducing bacteria into his eyes and needs to try to stop  02/20/17 OV: Henry Collins is here for CPE.  He was seen by psychiatrist 02/01/17- Recommended to try Lamictal 100 mg daily to help residual mood lability.  Continue Lexapro 20 mg daily.  He reports med compliance and denies SE. He denies thoughts of harming himself or others. He estimates to drink 3-4 bottles water/day and eats a diet high in fat/cho. He walks and lifts a lot with work, but doesn't exercise or "get my heart rate up". Recent labs- vit d def and concerning lipid panel- Tot 285, TGs 270, HDL 29, LDL 202 ALT 51 Recommended to start Atorvastatin 20mg  and re-check LFTs in 6 weeks    Patient Care Team    Relationship Specialty Notifications Start End  Julaine Fusianford, Katy D, NP PCP - General Family Medicine  01/12/17   [facility]    01/18/17     Patient Active Problem List   Diagnosis Date Noted  . Need for Tdap vaccination 02/20/2017  . Elevated LDL cholesterol level 02/20/2017  . Low HDL (under 40) 02/20/2017  . Healthcare maintenance 01/12/2017  . Blurred vision 01/12/2017  . Anxiety 01/12/2017  . HEMORRHOIDS, INTERNAL 09/24/2009  . GERD 09/24/2009  . RECTAL BLEEDING 09/24/2009     Past Medical History:  Diagnosis Date  . Allergy   . Anxiety   . Arthritis   . GERD (gastroesophageal reflux disease)      Past Surgical History:  Procedure Laterality Date  . ARTERIAL THROMBECTOMY Right    Accident at work  . WRIST SURGERY Right      Family History  Problem Relation Age of Onset  . Depression Mother   . Anxiety disorder Mother   . Alcohol abuse Mother   . Breast cancer Mother   . Diabetes Maternal Grandfather   . Breast cancer Maternal Grandmother   . Hypertension Paternal Grandfather   . Hypertension Father      Social History   Substance and Sexual Activity  Drug Use No     Social History   Substance and Sexual Activity  Alcohol Use No     Social History   Tobacco Use  Smoking Status Former Smoker  . Packs/day: 1.00  . Years: 13.00  . Pack years: 13.00  .  Types: Cigarettes  . Last attempt to quit: 06/30/2008  . Years since quitting: 8.6  Smokeless Tobacco Never Used     Outpatient Encounter Medications as of 02/20/2017  Medication Sig  . escitalopram (LEXAPRO) 20 MG tablet Take 1 tablet (20 mg total) by mouth daily.  Marland Kitchen lamoTRIgine (LAMICTAL) 100 MG tablet Take 1 tablet (100 mg total) by mouth daily.  Marland Kitchen atorvastatin (LIPITOR) 40 MG tablet Take 1 tablet (40 mg total) by mouth daily.  . Vitamin D, Ergocalciferol, (DRISDOL) 50000 units CAPS capsule Take 1 capsule (50,000 Units total) by mouth every 7 (seven) days.  . [DISCONTINUED] NEXIUM 40 MG capsule TAKE ONE CAPSULE BY MOUTH 30 MINUTES PRIOR TO BREAKFAST   No facility-administered encounter medications  on file as of 02/20/2017.     Allergies: Patient has no known allergies.  Body mass index is 28.33 kg/m.  Blood pressure 124/72, pulse 71, height 6' (1.829 m), weight 208 lb 14.4 oz (94.8 kg), SpO2 98 %.   Review of Systems  Constitutional: Positive for activity change and fatigue. Negative for appetite change, chills, diaphoresis, fever and unexpected weight change.  HENT: Negative for congestion.   Eyes: Positive for visual disturbance.  Respiratory: Negative for cough, chest tightness, shortness of breath, wheezing and stridor.   Cardiovascular: Negative for chest pain, palpitations and leg swelling.  Genitourinary: Negative for difficulty urinating and flank pain.  Skin: Negative for color change, pallor, rash and wound.  Neurological: Negative for dizziness and headaches.  Hematological: Does not bruise/bleed easily.  Psychiatric/Behavioral: Positive for sleep disturbance. Negative for decreased concentration, dysphoric mood, hallucinations, self-injury and suicidal ideas. The patient is nervous/anxious and is hyperactive.        Objective:   Physical Exam  Constitutional: He is oriented to person, place, and time. He appears well-developed and well-nourished. No distress.  HENT:  Head: Normocephalic and atraumatic.  Right Ear: Hearing, tympanic membrane, external ear and ear canal normal. Tympanic membrane is not erythematous and not bulging. No decreased hearing is noted.  Left Ear: Hearing, tympanic membrane, external ear and ear canal normal. Tympanic membrane is not erythematous and not bulging. No decreased hearing is noted.  Nose: Right sinus exhibits no maxillary sinus tenderness and no frontal sinus tenderness. Left sinus exhibits no maxillary sinus tenderness and no frontal sinus tenderness.  Mouth/Throat: Uvula is midline, oropharynx is clear and moist and mucous membranes are normal.  Eyes: Conjunctivae are normal. Pupils are equal, round, and reactive to light.   Neck: Normal range of motion. Neck supple.  Cardiovascular: Normal rate, regular rhythm, normal heart sounds and intact distal pulses.  No murmur heard. Pulmonary/Chest: Effort normal and breath sounds normal. No respiratory distress. He has no wheezes. He has no rales. He exhibits no tenderness.  Abdominal: Soft. Bowel sounds are normal. He exhibits no distension and no mass. There is no tenderness. There is no rebound and no guarding.  Musculoskeletal: Normal range of motion.  Lymphadenopathy:    He has no cervical adenopathy.  Neurological: He is alert and oriented to person, place, and time. Coordination normal.  Skin: Skin is warm and dry. No rash noted. No erythema. No pallor.  Psychiatric: He has a normal mood and affect. His behavior is normal. Judgment and thought content normal.  Nursing note and vitals reviewed.         Assessment & Plan:   1. Need for Tdap vaccination   2. Healthcare maintenance   3. Elevated LDL cholesterol level   4. Low  HDL (under 40)   5. Vitamin D deficiency   6. On statin therapy   7. Anxiety     Elevated LDL cholesterol level Ref Range & Units 11d ago 02/09/17  Cholesterol, Total 100 - 199 mg/dL 161 Abnormally high    Triglycerides 0 - 149 mg/dL 096 Abnormally high    HDL >39 mg/dL 29 Abnormally low    VLDL Cholesterol Cal 5 - 40 mg/dL 54 Abnormally high    LDL Calculated 0 - 99 mg/dL 045 Abnormally high     Started on Atorvastatin 40mg  nightly Increase water, exercise, follow heart healthy diet  Low HDL (under 40) Ref Range & Units 11d ago 02/09/17  Cholesterol, Total 100 - 199 mg/dL 409 Abnormally high    Triglycerides 0 - 149 mg/dL 811 Abnormally high    HDL >39 mg/dL 29 Abnormally low    VLDL Cholesterol Cal 5 - 40 mg/dL 54 Abnormally high    LDL Calculated 0 - 99 mg/dL 914 Abnormally high     Started on Atorvastatin 40mg  nightly Increase water, exercise, follow heart healthy diet  Healthcare maintenance Increase water  intake, strive for at least 100 ounces/day.   Follow Heart Healthy diet Increase regular exercise.  Recommend at least 30 minutes daily, 5 days per week of walking, jogging, biking, swimming, YouTube/Pinterest workout videos. Please start Atorvastatin 40mg  nightly and once weekl rx vit d. Please schedule lab appts- 6 weeks for liver function 4 months for lipids (fasting) and vit d Annual follow-up with labs physical with labs.  Anxiety Stable on Escitalopram 20mg  daily and Lamotrigine 100mg  daily He reports reduction in "irriatablity and anxiety"- great! Denies thoughts of harming himself/others Continue with mental health providers as directed/    FOLLOW-UP:  Return in about 1 year (around 02/20/2018) for CPE, Fasting Labs.

## 2017-02-20 ENCOUNTER — Ambulatory Visit (INDEPENDENT_AMBULATORY_CARE_PROVIDER_SITE_OTHER): Payer: BLUE CROSS/BLUE SHIELD | Admitting: Adult Health

## 2017-02-20 ENCOUNTER — Encounter: Payer: Self-pay | Admitting: Adult Health

## 2017-02-20 VITALS — BP 124/72 | HR 71 | Ht 72.0 in | Wt 208.9 lb

## 2017-02-20 DIAGNOSIS — F419 Anxiety disorder, unspecified: Secondary | ICD-10-CM | POA: Diagnosis not present

## 2017-02-20 DIAGNOSIS — Z0000 Encounter for general adult medical examination without abnormal findings: Secondary | ICD-10-CM | POA: Diagnosis not present

## 2017-02-20 DIAGNOSIS — E559 Vitamin D deficiency, unspecified: Secondary | ICD-10-CM

## 2017-02-20 DIAGNOSIS — Z23 Encounter for immunization: Secondary | ICD-10-CM | POA: Diagnosis not present

## 2017-02-20 DIAGNOSIS — E7800 Pure hypercholesterolemia, unspecified: Secondary | ICD-10-CM | POA: Diagnosis not present

## 2017-02-20 DIAGNOSIS — Z79899 Other long term (current) drug therapy: Secondary | ICD-10-CM

## 2017-02-20 DIAGNOSIS — E786 Lipoprotein deficiency: Secondary | ICD-10-CM

## 2017-02-20 MED ORDER — ATORVASTATIN CALCIUM 40 MG PO TABS: 40.0000 mg | ORAL_TABLET | Freq: Every day | ORAL | 1 refills | Status: DC

## 2017-02-20 MED ORDER — VITAMIN D (ERGOCALCIFEROL) 1.25 MG (50000 UNIT) PO CAPS: 50000.0000 [IU] | ORAL_CAPSULE | ORAL | 0 refills | Status: DC

## 2017-02-20 NOTE — Assessment & Plan Note (Signed)
Ref Range & Units 11d ago 02/09/17  Cholesterol, Total 100 - 199 mg/dL 285 Abnormally high    Triglycerides 0 - 149 mg/dL 270 Abnormally high    HDL >39 mg/dL 29 Abnormally low    VLDL Cholesterol Cal 5 - 40 mg/dL 54 Abnormally high    LDL Calculated 0 - 99 mg/dL 202 Abnormally high     Started on Atorvastatin 40mg nightly Increase water, exercise, follow heart healthy diet 

## 2017-02-20 NOTE — Assessment & Plan Note (Signed)
Ref Range & Units 11d ago 02/09/17  Cholesterol, Total 100 - 199 mg/dL [id number] Abnormally high    Triglycerides 0 - 149 mg/dL [id number] Abnormally high    HDL >39 mg/dL 29 Abnormally low    VLDL Cholesterol Cal 5 - 40 mg/dL 54 Abnormally high    LDL Calculated 0 - 99 mg/dL [id number] Abnormally high     Started on Atorvastatin 40mg  nightly Increase water, exercise, follow heart healthy diet

## 2017-02-20 NOTE — Assessment & Plan Note (Signed)
Increase water intake, strive for at least 100 ounces/day.   Follow Heart Healthy diet Increase regular exercise.  Recommend at least 30 minutes daily, 5 days per week of walking, jogging, biking, swimming, YouTube/Pinterest workout videos. Please start Atorvastatin 40mg  nightly and once weekl rx vit d. Please schedule lab appts- 6 weeks for liver function 4 months for lipids (fasting) and vit d Annual follow-up with labs physical with labs.

## 2017-02-20 NOTE — Assessment & Plan Note (Signed)
Stable on Escitalopram 20mg  daily and Lamotrigine 100mg  daily He reports reduction in "irriatablity and anxiety"- great! Denies thoughts of harming himself/others Continue with mental health providers as directed/

## 2017-02-20 NOTE — Patient Instructions (Signed)
Heart-Healthy Eating Plan Many factors influence your heart health, including eating and exercise habits. Heart (coronary) risk increases with abnormal blood fat (lipid) levels. Heart-healthy meal planning includes limiting unhealthy fats, increasing healthy fats, and making other small dietary changes. This includes maintaining a healthy body weight to help keep lipid levels within a normal range. What is my plan? Your health care provider recommends that you:  Get no more than ___25____% of the total calories in your daily diet from fat.  Limit your intake of saturated fat to less than ___5____% of your total calories each day.  Limit the amount of cholesterol in your diet to less than _200__ mg per day.  What types of fat should I choose?  Choose healthy fats more often. Choose monounsaturated and polyunsaturated fats, such as olive oil and canola oil, flaxseeds, walnuts, almonds, and seeds.  Eat more omega-3 fats. Good choices include salmon, mackerel, sardines, tuna, flaxseed oil, and ground flaxseeds. Aim to eat fish at least two times each week.  Limit saturated fats. Saturated fats are primarily found in animal products, such as meats, butter, and cream. Plant sources of saturated fats include palm oil, palm kernel oil, and coconut oil.  Avoid foods with partially hydrogenated oils in them. These contain trans fats. Examples of foods that contain trans fats are stick margarine, some tub margarines, cookies, crackers, and other baked goods. What general guidelines do I need to follow?  Check food labels carefully to identify foods with trans fats or high amounts of saturated fat.  Fill one half of your plate with vegetables and green salads. Eat 4-5 servings of vegetables per day. A serving of vegetables equals 1 cup of raw leafy vegetables,  cup of raw or cooked cut-up vegetables, or  cup of vegetable juice.  Fill one fourth of your plate with whole grains. Look for the word  "whole" as the first word in the ingredient list.  Fill one fourth of your plate with lean protein foods.  Eat 4-5 servings of fruit per day. A serving of fruit equals one medium whole fruit,  cup of dried fruit,  cup of fresh, frozen, or canned fruit, or  cup of 100% fruit juice.  Eat more foods that contain soluble fiber. Examples of foods that contain this type of fiber are apples, broccoli, carrots, beans, peas, and barley. Aim to get 20-30 g of fiber per day.  Eat more home-cooked food and less restaurant, buffet, and fast food.  Limit or avoid alcohol.  Limit foods that are high in starch and sugar.  Avoid fried foods.  Cook foods by using methods other than frying. Baking, boiling, grilling, and broiling are all great options. Other fat-reducing suggestions include: ? Removing the skin from poultry. ? Removing all visible fats from meats. ? Skimming the fat off of stews, soups, and gravies before serving them. ? Steaming vegetables in water or broth.  Lose weight if you are overweight. Losing just 5-10% of your initial body weight can help your overall health and prevent diseases such as diabetes and heart disease.  Increase your consumption of nuts, legumes, and seeds to 4-5 servings per week. One serving of dried beans or legumes equals  cup after being cooked, one serving of nuts equals 1 ounces, and one serving of seeds equals  ounce or 1 tablespoon.  You may need to monitor your salt (sodium) intake, especially if you have high blood pressure. Talk with your health care provider or dietitian to get  more information about reducing sodium. What foods can I eat? Grains  Breads, including French, white, pita, wheat, raisin, rye, oatmeal, and Italian. Tortillas that are neither fried nor made with lard or trans fat. Low-fat rolls, including hotdog and hamburger buns and English muffins. Biscuits. Muffins. Waffles. Pancakes. Light popcorn. Whole-grain cereals. Flatbread.  Melba toast. Pretzels. Breadsticks. Rusks. Low-fat snacks and crackers, including oyster, saltine, matzo, graham, animal, and rye. Rice and pasta, including brown rice and those that are made with whole wheat. Vegetables All vegetables. Fruits All fruits, but limit coconut. Meats and Other Protein Sources Lean, well-trimmed beef, veal, pork, and lamb. Chicken and turkey without skin. All fish and shellfish. Wild duck, rabbit, pheasant, and venison. Egg whites or low-cholesterol egg substitutes. Dried beans, peas, lentils, and tofu.Seeds and most nuts. Dairy Low-fat or nonfat cheeses, including ricotta, string, and mozzarella. Skim or 1% milk that is liquid, powdered, or evaporated. Buttermilk that is made with low-fat milk. Nonfat or low-fat yogurt. Beverages Mineral water. Diet carbonated beverages. Sweets and Desserts Sherbets and fruit ices. Honey, jam, marmalade, jelly, and syrups. Meringues and gelatins. Pure sugar candy, such as hard candy, jelly beans, gumdrops, mints, marshmallows, and small amounts of dark chocolate. Angel food cake. Eat all sweets and desserts in moderation. Fats and Oils Nonhydrogenated (trans-free) margarines. Vegetable oils, including soybean, sesame, sunflower, olive, peanut, safflower, corn, canola, and cottonseed. Salad dressings or mayonnaise that are made with a vegetable oil. Limit added fats and oils that you use for cooking, baking, salads, and as spreads. Other Cocoa powder. Coffee and tea. All seasonings and condiments. The items listed above may not be a complete list of recommended foods or beverages. Contact your dietitian for more options. What foods are not recommended? Grains Breads that are made with saturated or trans fats, oils, or whole milk. Croissants. Butter rolls. Cheese breads. Sweet rolls. Donuts. Buttered popcorn. Chow mein noodles. High-fat crackers, such as cheese or butter crackers. Meats and Other Protein Sources Fatty meats, such  as hotdogs, short ribs, sausage, spareribs, bacon, ribeye roast or steak, and mutton. High-fat deli meats, such as salami and bologna. Caviar. Domestic duck and goose. Organ meats, such as kidney, liver, sweetbreads, brains, gizzard, chitterlings, and heart. Dairy Cream, sour cream, cream cheese, and creamed cottage cheese. Whole milk cheeses, including blue (bleu), Monterey Jack, Brie, Colby, American, Havarti, Swiss, cheddar, Camembert, and [place]. Whole or 2% milk that is liquid, evaporated, or condensed. Whole buttermilk. Cream sauce or high-fat cheese sauce. Yogurt that is made from whole milk. Beverages Regular sodas and drinks with added sugar. Sweets and Desserts Frosting. Pudding. Cookies. Cakes other than angel food cake. Candy that has milk chocolate or white chocolate, hydrogenated fat, butter, coconut, or unknown ingredients. Buttered syrups. Full-fat ice cream or ice cream drinks. Fats and Oils Gravy that has suet, meat fat, or shortening. Cocoa butter, hydrogenated oils, palm oil, coconut oil, palm kernel oil. These can often be found in baked products, candy, fried foods, nondairy creamers, and whipped toppings. Solid fats and shortenings, including bacon fat, salt pork, lard, and butter. Nondairy cream substitutes, such as coffee creamers and sour cream substitutes. Salad dressings that are made of unknown oils, cheese, or sour cream. The items listed above may not be a complete list of foods and beverages to avoid. Contact your dietitian for more information. This information is not intended to replace advice given to you by your health care provider. Make sure you discuss any questions you have with your health care   provider. Document Released: 10/06/2007 Document Revised: 07/17/2015 Document Reviewed: 06/20/2013 Elsevier Interactive Patient Education  2018 [employer]  Atorvastatin tablets What is this medicine? ATORVASTATIN (a TORE va sta tin) is known as a HMG-CoA reductase  inhibitor or 'statin'. It lowers the level of cholesterol and triglycerides in the blood. This drug may also reduce the risk of heart attack, stroke, or other health problems in patients with risk factors for heart disease. Diet and lifestyle changes are often used with this drug. This medicine may be used for other purposes; ask your health care provider or pharmacist if you have questions. COMMON BRAND NAME(S): Lipitor What should I tell my health care provider before I take this medicine? They need to know if you have any of these conditions: -frequently drink alcoholic beverages -history of stroke, TIA -kidney disease -liver disease -muscle aches or weakness -other medical condition -an unusual or allergic reaction to atorvastatin, other medicines, foods, dyes, or preservatives -pregnant or trying to get pregnant -breast-feeding How should I use this medicine? Take this medicine by mouth with a glass of water. Follow the directions on the prescription label. You can take this medicine with or without food. Take your doses at regular intervals. Do not take your medicine more often than directed. Talk to your pediatrician regarding the use of this medicine in children. While this drug may be prescribed for children as young as 39 years old for selected conditions, precautions do apply. Overdosage: If you think you have taken too much of this medicine contact a poison control center or emergency room at once. NOTE: This medicine is only for you. Do not share this medicine with others. What if I miss a dose? If you miss a dose, take it as soon as you can. If it is almost time for your next dose, take only that dose. Do not take double or extra doses. What may interact with this medicine? Do not take this medicine with any of the following medications: -red yeast rice -telaprevir -telithromycin -voriconazole This medicine may also interact with the following  medications: -alcohol -antiviral medicines for HIV or AIDS -boceprevir -certain antibiotics like clarithromycin, erythromycin, troleandomycin -certain medicines for cholesterol like fenofibrate or gemfibrozil -cimetidine -clarithromycin -colchicine -cyclosporine -digoxin -male hormones, like estrogens or progestins and birth control pills -grapefruit juice -medicines for fungal infections like fluconazole, itraconazole, ketoconazole -niacin -rifampin -spironolactone This list may not describe all possible interactions. Give your health care provider a list of all the medicines, herbs, non-prescription drugs, or dietary supplements you use. Also tell them if you smoke, drink alcohol, or use illegal drugs. Some items may interact with your medicine. What should I watch for while using this medicine? Visit your doctor or health care professional for regular check-ups. You may need regular tests to make sure your liver is working properly. Tell your doctor or health care professional right away if you get any unexplained muscle pain, tenderness, or weakness, especially if you also have a fever and tiredness. Your doctor or health care professional may tell you to stop taking this medicine if you develop muscle problems. If your muscle problems do not go away after stopping this medicine, contact your health care professional. This drug is only part of a total heart-health program. Your doctor or a dietician can suggest a low-cholesterol and low-fat diet to help. Avoid alcohol and smoking, and keep a proper exercise schedule. Do not use this drug if you are pregnant or breast-feeding. Serious side effects to an unborn  child or to an infant are possible. Talk to your doctor or pharmacist for more information. This medicine may affect blood sugar levels. If you have diabetes, check with your doctor or health care professional before you change your diet or the dose of your diabetic medicine. If  you are going to have surgery tell your health care professional that you are taking this drug. What side effects may I notice from receiving this medicine? Side effects that you should report to your doctor or health care professional as soon as possible: -allergic reactions like skin rash, itching or hives, swelling of the face, lips, or tongue -dark urine -fever -joint pain -muscle cramps, pain -redness, blistering, peeling or loosening of the skin, including inside the mouth -trouble passing urine or change in the amount of urine -unusually weak or tired -yellowing of eyes or skin Side effects that usually do not require medical attention (report to your doctor or health care professional if they continue or are bothersome): -constipation -heartburn -stomach gas, pain, upset This list may not describe all possible side effects. Call your doctor for medical advice about side effects. You may report side effects to FDA at 1-800-FDA-1088. Where should I keep my medicine? Keep out of the reach of children. Store at room temperature between 20 to 25 degrees C (68 to 77 degrees F). Throw away any unused medicine after the expiration date. NOTE: This sheet is a summary. It may not cover all possible information. If you have questions about this medicine, talk to your doctor, pharmacist, or health care provider.  2018 Elsevier/Gold Standard (2010-11-16 09:18:24)  Increase water intake, strive for at least 100 ounces/day.   Follow Heart Healthy diet Increase regular exercise.  Recommend at least 30 minutes daily, 5 days per week of walking, jogging, biking, swimming, YouTube/Pinterest workout videos. Please start Atorvastatin 40mg  nightly and once weekl rx vit d. Please schedule lab appts- 6 weeks for liver function 4 months for lipids (fasting) and vit d Annual follow-up with labs physical with labs. NICE TO SEE YOU!

## 2017-02-23 DIAGNOSIS — R0981 Nasal congestion: Secondary | ICD-10-CM | POA: Diagnosis not present

## 2017-02-23 DIAGNOSIS — J018 Other acute sinusitis: Secondary | ICD-10-CM | POA: Diagnosis not present

## 2017-03-11 DIAGNOSIS — J0100 Acute maxillary sinusitis, unspecified: Secondary | ICD-10-CM | POA: Diagnosis not present

## 2017-03-25 ENCOUNTER — Emergency Department (HOSPITAL_BASED_OUTPATIENT_CLINIC_OR_DEPARTMENT_OTHER)
Admission: EM | Admit: 2017-03-25 | Discharge: 2017-03-25 | Disposition: A | Discharge status: Home / Self Care | Payer: BLUE CROSS/BLUE SHIELD | Attending: Emergency Medicine | Admitting: Emergency Medicine

## 2017-03-25 ENCOUNTER — Other Ambulatory Visit: Payer: Self-pay

## 2017-03-25 ENCOUNTER — Encounter (HOSPITAL_BASED_OUTPATIENT_CLINIC_OR_DEPARTMENT_OTHER): Payer: Self-pay | Admitting: Emergency Medicine

## 2017-03-25 DIAGNOSIS — R05 Cough: Secondary | ICD-10-CM | POA: Insufficient documentation

## 2017-03-25 DIAGNOSIS — R51 Headache: Secondary | ICD-10-CM | POA: Diagnosis not present

## 2017-03-25 DIAGNOSIS — R067 Sneezing: Secondary | ICD-10-CM | POA: Diagnosis not present

## 2017-03-25 DIAGNOSIS — Z79899 Other long term (current) drug therapy: Secondary | ICD-10-CM | POA: Diagnosis not present

## 2017-03-25 DIAGNOSIS — Z87891 Personal history of nicotine dependence: Secondary | ICD-10-CM | POA: Diagnosis not present

## 2017-03-25 DIAGNOSIS — J0141 Acute recurrent pansinusitis: Secondary | ICD-10-CM

## 2017-03-25 MED ORDER — CLINDAMYCIN HCL 150 MG PO CAPS
450.0000 mg | ORAL_CAPSULE | Freq: Once | ORAL | Status: AC
  Administered 2017-03-25: 450 mg via ORAL
  Filled 2017-03-25: qty 3

## 2017-03-25 MED ORDER — CLINDAMYCIN HCL 150 MG PO CAPS: 450.0000 mg | ORAL_CAPSULE | Freq: Three times a day (TID) | ORAL | 0 refills | Status: AC

## 2017-03-25 NOTE — Discharge Instructions (Signed)
Please take all of your antibiotics until finished!   You may develop abdominal discomfort or diarrhea from the antibiotic.  You may help offset this with probiotics which you can buy or get in yogurt. Do not eat  or take the probiotics until 2 hours after your antibiotic.  If you do develop persistent diarrhea from this medication stop taking it and go to the emergency department or follow-up with your primary care physician for reevaluation.  Continue using Claritin and Flonase.  He may also use nasal saline spray and nasal saline rinses.  Follow-up with your primary care physician or an ENT for reevaluation of your recurrent sinusitis.  Return to the emergency department if any concerning signs or symptoms develop.

## 2017-03-25 NOTE — ED Triage Notes (Signed)
Pt c/o sinus issues ongoing 6-7 weeks. Pt has been seen by medical doctors for same and given different rounds of antibiotics. Pt also has tried OTC medications without relief.

## 2017-03-25 NOTE — ED Provider Notes (Signed)
[facility] EMERGENCY DEPARTMENT Provider Note   CSN: [id number] Arrival date & time: 03/25/17  1729     History   Chief Complaint Chief Complaint  Patient presents with  . Facial Pain    HPI Henry Collins is a 37 y.o. male with history of GERD, seasonal allergies, and anxiety presents today for evaluation of acute onset, waxing and waning rhinorrhea and sinus pain for 6 weeks.  He states that he is prone to sinus infections but this is the longest he has ever had to experience the symptoms.  He was initially treated with amoxicillin and had some improvement in symptoms for 2-3 days with recurrence in symptoms.  He then went to an urgent care where he was given Augmentin and oral steroid with again some improvement in symptoms but no resolution.  He states most recently symptoms worsened 3 days ago.  He has been using Claritin and Flonase without significant relief of his symptoms.  He denies fevers.  He endorses constant throbbing frontal headache and pain which radiates from his cheeks to his ears.  He denies sore throat, shortness of breath, or chest pain.  He does endorse a cough which is nonproductive.  The history is provided by the patient.    Past Medical History:  Diagnosis Date  . Allergy   . Anxiety   . Arthritis   . GERD (gastroesophageal reflux disease)     Patient Active Problem List   Diagnosis Date Noted  . Need for Tdap vaccination 02/20/2017  . Elevated LDL cholesterol level 02/20/2017  . Low HDL (under 40) 02/20/2017  . Healthcare maintenance 01/12/2017  . Blurred vision 01/12/2017  . Anxiety 01/12/2017  . HEMORRHOIDS, INTERNAL 09/24/2009  . GERD 09/24/2009  . RECTAL BLEEDING 09/24/2009    Past Surgical History:  Procedure Laterality Date  . ARTERIAL THROMBECTOMY Right    Accident at work  . WRIST SURGERY Right        Home Medications    Prior to Admission medications   Medication Sig Start Date End Date Taking? Authorizing  Provider  atorvastatin (LIPITOR) 40 MG tablet Take 1 tablet (40 mg total) by mouth daily. 02/20/17   Danford, Orpha Bur D, NP  clindamycin (CLEOCIN) 150 MG capsule Take 3 capsules (450 mg total) by mouth 3 (three) times daily for 7 days. 03/25/17 04/01/17  Michela Pitcher A, PA-C  escitalopram (LEXAPRO) 20 MG tablet Take 1 tablet (20 mg total) by mouth daily. 02/01/17   Arfeen, Phillips Grout, MD  lamoTRIgine (LAMICTAL) 100 MG tablet Take 1 tablet (100 mg total) by mouth daily. 02/01/17   Arfeen, Phillips Grout, MD  Vitamin D, Ergocalciferol, (DRISDOL) 50000 units CAPS capsule Take 1 capsule (50,000 Units total) by mouth every 7 (seven) days. 02/20/17   Julaine Fusi, NP    Family History Family History  Problem Relation Age of Onset  . Depression Mother   . Anxiety disorder Mother   . Alcohol abuse Mother   . Breast cancer Mother   . Diabetes Maternal Grandfather   . Breast cancer Maternal Grandmother   . Hypertension Paternal Grandfather   . Hypertension Father     Social History Social History   Tobacco Use  . Smoking status: Former Smoker    Packs/day: 1.00    Years: 13.00    Pack years: 13.00    Types: Cigarettes    Last attempt to quit: 06/30/2008    Years since quitting: 8.7  . Smokeless tobacco: Never Used  Substance  Use Topics  . Alcohol use: No  . Drug use: No     Allergies   Patient has no known allergies.   Review of Systems Review of Systems  Constitutional: Negative for chills and fever.  HENT: Positive for congestion, sinus pressure, sinus pain and sneezing. Negative for drooling and sore throat.   Respiratory: Positive for cough. Negative for shortness of breath.   Cardiovascular: Negative for chest pain.     Physical Exam Updated Vital Signs BP 133/81 (BP Location: Left Arm)   Pulse 80   Temp 97.8 F (36.6 C) (Oral)   Resp 18   Ht 6\' 1"  (1.854 m)   Wt 94.8 kg (209 lb)   SpO2 100%   BMI 27.57 kg/m   Physical Exam  Constitutional: He appears well-developed and  well-nourished. No distress.  HENT:  Head: Normocephalic and atraumatic.  Right Ear: External ear normal.  Left Ear: External ear normal.  TMs with scarring from chronic ear infections.  There is mid ear effusion bilaterally.  No erythema or bulging.  Nasal septum is midline with mucosal edema bilaterally, right worse than left.  He has tenderness to palpation of the bilateral frontal and maxillary sinuses.  Posterior oropharynx with postnasal drip but no erythema, tonsillar hypertrophy, exudates, or uvular deviation.  Eyes: Conjunctivae and EOM are normal. Pupils are equal, round, and reactive to light. Right eye exhibits no discharge. Left eye exhibits no discharge.  Neck: Normal range of motion and full passive range of motion without pain. Neck supple. No JVD present. No tracheal deviation present.  Bilateral anterior cervical lymphadenopathy  Cardiovascular: Normal rate, regular rhythm and normal heart sounds.  Pulmonary/Chest: Effort normal and breath sounds normal.  Abdominal: He exhibits no distension.  Musculoskeletal: He exhibits no edema.  Lymphadenopathy:    He has cervical adenopathy.  Neurological: He is alert.  Skin: Skin is warm and dry. No erythema.  Psychiatric: He has a normal mood and affect. His behavior is normal.  Nursing note and vitals reviewed.    ED Treatments / Results  Labs (all labs ordered are listed, but only abnormal results are displayed) Labs Reviewed - No data to display  EKG  EKG Interpretation None       Radiology No results found.  Procedures Procedures (including critical care time)  Medications Ordered in ED Medications  clindamycin (CLEOCIN) capsule 450 mg (not administered)     Initial Impression / Assessment and Plan / ED Course  I have reviewed the triage vital signs and the nursing notes.  Pertinent labs & imaging results that were available during my care of the patient were reviewed by me and considered in my medical  decision making (see chart for details).     Patient complaining of symptoms of recurrent sinusitis.  He is afebrile, vital signs are stable.  He is nontoxic but somewhat uncomfortable in appearance. Severe symptoms have been present for greater than 10 days with purulent nasal discharge and maxillary sinus pain.  Concern for acute bacterial rhinosinusitis.  No fever or meningeal signs to suggest meningitis.  Presentation not concerning for PTA or strep pharyngitis.  Patient discharged with clindamycin given he has failed amoxicillin and Augmentin.  Encouraged patient to continue using Claritin and Flonase. Instructions given for warm saline nasal washes and recommendations for follow-up with ENT for recurrent sinusitis.  Discussed indications for return to the ED. Pt verbalized understanding of and agreement with plan and is safe for discharge home at this time.  Final Clinical Impressions(s) / ED Diagnoses   Final diagnoses:  Acute recurrent pansinusitis    ED Discharge Orders        Ordered    clindamycin (CLEOCIN) 150 MG capsule  3 times daily     03/25/17 1826       Jeanie SewerFawze, Dimas Scheck A, PA-C 03/25/17 1833    Tilden Fossaees, Elizabeth, MD 03/26/17 0225

## 2017-03-29 NOTE — Progress Notes (Signed)
Subjective:    Patient ID: Henry Collins, male    DOB: 1980/02/09, 37 y.o.   MRN: [id number]  HPI: 01/12/17 OV:  Henry Collins is here to establish as a new pt.  He is a pleasant 37 year old male.  WUJ:WJXB and anxiety-followed by psychiatrist and treated with escitalopram 20mg .  He has long-standing hx of severe anxiety and been trialed on other medications, however he cannot recall name.  He has not had regular medical care in years and is concerned about 30 lb wt gain since stating mental health medications 12 months ago. He has a 41 year old son, "Madelin Rear", runs a [employer] and has a strained relationship with his son's mother. His job is strenuous, however he denies formal exercise. He drinks 40 of water day and feels like he is eating less than a year ago, but continues to gain wt-? He denies tobacco/ETOH use. He see's psychiatrist once every 3 months, next OV 02/01/17 and therapist every month. He reports blurred vision with inner eye d/c that has steadily increased the last few months. During OV he constantly was touching his clothing then his eyes- discussed that he is introducing bacteria into his eyes and needs to try to stop  02/20/17 OV: Henry Collins is here for CPE.  He was seen by psychiatrist 02/01/17- Recommended to try Lamictal 100 mg daily to help residual mood lability.  Continue Lexapro 20 mg daily.  He reports med compliance and denies SE. He denies thoughts of harming himself or others. He estimates to drink 3-4 bottles water/day and eats a diet high in fat/cho. He walks and lifts a lot with work, but doesn't exercise or "get my heart rate up". Recent labs- vit d def and concerning lipid panel- Tot 285, TGs 270, HDL 29, LDL 202 ALT 51 Recommended to start Atorvastatin 20mg  and re-check LFTs in 6 weeks  03/29/17 OV: Henry Collins presents with persistent rhinorrhea and sinus pain for 6 weeks.   He has been treated amoxicillin, sx's resolved for three days then  developed again. He was seen at [facility] and given Augmentin and oral steroid with again some improvement in symptoms but no resolution.   He was treated at [facility] 03/25/17-given clindamycin given he has failed amoxicillin and Augmentin.   Encouraged patient to continue using Claritin and Flonase. Instructions given for warm saline nasal washes and recommendations for follow-up with ENT for recurrent sinusitis.  He is currently experiencing copious thick/green nasal drainage, post nasal gtt, and tender L anterior cervical neck tenderness r/t swollen lymph node. He is currently day 6 out of 10 day course of Clindamycin  He denies change in vision, but states " I can't see real well to begin with, I am supposed to wear glasses" He denies CP/dyspnea/palpitations/fever/night sweats/N/V/D/cough  Reviewed notes from all recent UC visits  Patient Care Team    Relationship Specialty Notifications Start End  Julaine Fusi, NP PCP - General Family Medicine  01/12/17   [facility]    01/18/17     Patient Active Problem List   Diagnosis Date Noted  . Need for Tdap vaccination 02/20/2017  . Elevated LDL cholesterol level 02/20/2017  . Low HDL (under 40) 02/20/2017  . Healthcare maintenance 01/12/2017  . Blurred vision 01/12/2017  . Anxiety 01/12/2017  . HEMORRHOIDS, INTERNAL 09/24/2009  . GERD 09/24/2009  . RECTAL BLEEDING 09/24/2009     Past Medical History:  Diagnosis Date  . Allergy   . Anxiety   .  Arthritis   . GERD (gastroesophageal reflux disease)      Past Surgical History:  Procedure Laterality Date  . ARTERIAL THROMBECTOMY Right    Accident at work  . WRIST SURGERY Right      Family History  Problem Relation Age of Onset  . Depression Mother   . Anxiety disorder Mother   . Alcohol abuse Mother   . Breast cancer Mother   . Diabetes Maternal Grandfather   . Breast cancer Maternal Grandmother   . Hypertension Paternal Grandfather   . Hypertension Father       Social History   Substance and Sexual Activity  Drug Use No     Social History   Substance and Sexual Activity  Alcohol Use No     Social History   Tobacco Use  Smoking Status Former Smoker  . Packs/day: 1.00  . Years: 13.00  . Pack years: 13.00  . Types: Cigarettes  . Last attempt to quit: 06/30/2008  . Years since quitting: 8.7  Smokeless Tobacco Never Used     Outpatient Encounter Medications as of 03/30/2017  Medication Sig  . atorvastatin (LIPITOR) 40 MG tablet Take 1 tablet (40 mg total) by mouth daily.  . clindamycin (CLEOCIN) 150 MG capsule Take 3 capsules (450 mg total) by mouth 3 (three) times daily for 7 days.  Marland Kitchen escitalopram (LEXAPRO) 20 MG tablet Take 1 tablet (20 mg total) by mouth daily.  Marland Kitchen lamoTRIgine (LAMICTAL) 100 MG tablet Take 1 tablet (100 mg total) by mouth daily.  . Vitamin D, Ergocalciferol, (DRISDOL) 50000 units CAPS capsule Take 1 capsule (50,000 Units total) by mouth every 7 (seven) days.   No facility-administered encounter medications on file as of 03/30/2017.     Allergies: Patient has no known allergies.  Body mass index is 28.54 kg/m.  Blood pressure 135/78, pulse 61, temperature 98 F (36.7 C), temperature source Oral, height 6' (1.829 m), weight 210 lb 6.4 oz (95.4 kg), SpO2 98 %.   Review of Systems  Constitutional: Positive for activity change and fatigue. Negative for appetite change, chills, diaphoresis, fever and unexpected weight change.  HENT: Positive for congestion, postnasal drip and sore throat. Negative for ear pain and voice change.   Eyes: Positive for visual disturbance.  Respiratory: Negative for cough, chest tightness, shortness of breath, wheezing and stridor.   Cardiovascular: Negative for chest pain, palpitations and leg swelling.  Genitourinary: Negative for difficulty urinating and flank pain.  Skin: Negative for color change, pallor, rash and wound.  Neurological: Negative for dizziness and  headaches.  Hematological: Does not bruise/bleed easily.  Psychiatric/Behavioral: Positive for sleep disturbance. Negative for decreased concentration, dysphoric mood, hallucinations, self-injury and suicidal ideas. The patient is nervous/anxious and is hyperactive.        Objective:   Physical Exam  Constitutional: He is oriented to person, place, and time. He appears well-developed and well-nourished. No distress.  HENT:  Head: Normocephalic and atraumatic.  Right Ear: Hearing, external ear and ear canal normal. No drainage or swelling. Tympanic membrane is bulging. Tympanic membrane is not erythematous. No decreased hearing is noted.  Left Ear: Hearing, external ear and ear canal normal. No drainage or swelling. Tympanic membrane is bulging. Tympanic membrane is not erythematous. No decreased hearing is noted.  Nose: Mucosal edema and rhinorrhea present. Right sinus exhibits no maxillary sinus tenderness and no frontal sinus tenderness. Left sinus exhibits no maxillary sinus tenderness and no frontal sinus tenderness.  Mouth/Throat: Uvula is midline and mucous membranes  are normal. Posterior oropharyngeal erythema present. No oropharyngeal exudate, posterior oropharyngeal edema or tonsillar abscesses.  Bubbles behind L TM   Eyes: Conjunctivae are normal. Pupils are equal, round, and reactive to light.  Neck: Normal range of motion. Neck supple.  L cervical lymphadenopathy  Cardiovascular: Normal rate, regular rhythm, normal heart sounds and intact distal pulses.  No murmur heard. Pulmonary/Chest: Effort normal and breath sounds normal. No respiratory distress. He has no wheezes. He has no rales. He exhibits no tenderness.  Musculoskeletal: Normal range of motion.  Lymphadenopathy:    He has cervical adenopathy.  Neurological: He is alert and oriented to person, place, and time.  Skin: Skin is warm and dry. No rash noted. No erythema. No pallor.  Psychiatric: He has a normal mood and  affect. His behavior is normal. Judgment and thought content normal.  Nursing note and vitals reviewed.         Assessment & Plan:   1. Recurrent sinusitis     Recurrent sinusitis Stop OTC Claritan. Start once daily montelukast. Use Flonase as directed. Complete current course of antibiotic. Referral to ENT placed. Continue to push fluids and alternate OTC Acetaminophen and Ibuprofen for discomfort.  Pt was in the office today for 25+ minutes, with over 50% time spent in face to face counseling of patient's various medical conditions and in coordination of care  FOLLOW-UP:  Return if symptoms worsen or fail to improve. No diagnosis found.  No problem-specific Assessment & Plan notes found for this encounter.    FOLLOW-UP:  No follow-ups on file.

## 2017-03-30 ENCOUNTER — Encounter: Payer: Self-pay | Admitting: Adult Health

## 2017-03-30 ENCOUNTER — Ambulatory Visit (INDEPENDENT_AMBULATORY_CARE_PROVIDER_SITE_OTHER): Payer: BLUE CROSS/BLUE SHIELD | Admitting: Adult Health

## 2017-03-30 VITALS — BP 135/78 | HR 61 | Temp 98.0°F | Ht 72.0 in | Wt 210.4 lb

## 2017-03-30 DIAGNOSIS — J329 Chronic sinusitis, unspecified: Secondary | ICD-10-CM | POA: Insufficient documentation

## 2017-03-30 MED ORDER — FLUTICASONE PROPIONATE 50 MCG/ACT NA SUSP: 2.0000 | Freq: Every day | NASAL | 6 refills | Status: DC

## 2017-03-30 MED ORDER — MONTELUKAST SODIUM 10 MG PO TABS: 10.0000 mg | ORAL_TABLET | Freq: Every day | ORAL | 2 refills | Status: DC

## 2017-03-30 NOTE — Assessment & Plan Note (Signed)
Stop OTC Claritan. Start once daily montelukast. Use Flonase as directed. Complete current course of antibiotic. Referral to ENT placed. Continue to push fluids and alternate OTC Acetaminophen and Ibuprofen for discomfort.

## 2017-03-30 NOTE — Patient Instructions (Signed)

## 2017-04-03 ENCOUNTER — Other Ambulatory Visit: Payer: BLUE CROSS/BLUE SHIELD

## 2017-04-03 DIAGNOSIS — E559 Vitamin D deficiency, unspecified: Secondary | ICD-10-CM

## 2017-04-03 DIAGNOSIS — E7800 Pure hypercholesterolemia, unspecified: Secondary | ICD-10-CM

## 2017-04-03 DIAGNOSIS — E786 Lipoprotein deficiency: Secondary | ICD-10-CM | POA: Diagnosis not present

## 2017-04-03 DIAGNOSIS — J302 Other seasonal allergic rhinitis: Secondary | ICD-10-CM | POA: Insufficient documentation

## 2017-04-03 DIAGNOSIS — Z79899 Other long term (current) drug therapy: Secondary | ICD-10-CM

## 2017-04-03 DIAGNOSIS — M26622 Arthralgia of left temporomandibular joint: Secondary | ICD-10-CM | POA: Diagnosis not present

## 2017-04-03 DIAGNOSIS — M26629 Arthralgia of temporomandibular joint, unspecified side: Secondary | ICD-10-CM | POA: Insufficient documentation

## 2017-04-04 ENCOUNTER — Other Ambulatory Visit: Payer: Self-pay | Admitting: Adult Health

## 2017-04-04 DIAGNOSIS — Z79899 Other long term (current) drug therapy: Secondary | ICD-10-CM

## 2017-04-04 LAB — HEPATIC FUNCTION PANEL
ALT: 95 IU/L — ABNORMAL HIGH (ref 0–44)
AST: 39 IU/L (ref 0–40)
Albumin: 4.5 g/dL (ref 3.5–5.5)
Alkaline Phosphatase: 106 IU/L (ref 39–117)
Bilirubin Total: 0.6 mg/dL (ref 0.0–1.2)
Bilirubin, Direct: 0.18 mg/dL (ref 0.00–0.40)
TOTAL PROTEIN: 6.9 g/dL (ref 6.0–8.5)

## 2017-04-04 LAB — LIPID PANEL
Chol/HDL Ratio: 6 ratio — ABNORMAL HIGH (ref 0.0–5.0)
Cholesterol, Total: 193 mg/dL (ref 100–199)
HDL: 32 mg/dL — ABNORMAL LOW (ref >39)
LDL Calculated: 129 mg/dL — ABNORMAL HIGH (ref 0–99)
Triglycerides: 158 mg/dL — ABNORMAL HIGH (ref 0–149)
VLDL CHOLESTEROL CAL: 32 mg/dL (ref 5–40)

## 2017-04-04 LAB — VITAMIN D 25 HYDROXY (VIT D DEFICIENCY, FRACTURES): VIT D 25 HYDROXY: 32.5 ng/mL (ref 30.0–100.0)

## 2017-04-04 MED ORDER — ATORVASTATIN CALCIUM 20 MG PO TABS: ORAL_TABLET | ORAL | 1 refills | Status: DC

## 2017-04-04 NOTE — Progress Notes (Signed)
VM was left for patient to call office to obtain results. W.Sosaia Pittinger, CMA

## 2017-04-04 NOTE — Progress Notes (Signed)
Pt returned phone call and was given results. Pt stated that he understood information and instructions given. W.Myrth Dahan, CMA.

## 2017-05-03 ENCOUNTER — Ambulatory Visit (HOSPITAL_COMMUNITY): Payer: BLUE CROSS/BLUE SHIELD | Admitting: Psychiatry

## 2017-05-03 ENCOUNTER — Encounter (HOSPITAL_COMMUNITY): Payer: Self-pay | Admitting: Psychiatry

## 2017-05-03 VITALS — BP 126/72 | HR 72 | Ht 73.0 in | Wt 210.0 lb

## 2017-05-03 DIAGNOSIS — Z811 Family history of alcohol abuse and dependence: Secondary | ICD-10-CM

## 2017-05-03 DIAGNOSIS — Z62811 Personal history of psychological abuse in childhood: Secondary | ICD-10-CM

## 2017-05-03 DIAGNOSIS — Z818 Family history of other mental and behavioral disorders: Secondary | ICD-10-CM | POA: Diagnosis not present

## 2017-05-03 DIAGNOSIS — Z87891 Personal history of nicotine dependence: Secondary | ICD-10-CM

## 2017-05-03 DIAGNOSIS — F411 Generalized anxiety disorder: Secondary | ICD-10-CM

## 2017-05-03 DIAGNOSIS — F39 Unspecified mood [affective] disorder: Secondary | ICD-10-CM | POA: Diagnosis not present

## 2017-05-03 MED ORDER — LAMOTRIGINE 100 MG PO TABS: 100.0000 mg | ORAL_TABLET | Freq: Every day | ORAL | 0 refills | Status: DC

## 2017-05-03 MED ORDER — HYDROXYZINE HCL 10 MG PO TABS: ORAL_TABLET | ORAL | 0 refills | Status: DC

## 2017-05-03 MED ORDER — BUPROPION HCL ER (XL) 150 MG PO TB24: 150.0000 mg | ORAL_TABLET | Freq: Every day | ORAL | 0 refills | Status: DC
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
## 2017-05-26 ENCOUNTER — Other Ambulatory Visit (HOSPITAL_COMMUNITY): Payer: Self-pay | Admitting: Psychiatry

## 2017-05-26 DIAGNOSIS — F39 Unspecified mood [affective] disorder: Secondary | ICD-10-CM

## 2017-06-06 ENCOUNTER — Ambulatory Visit (HOSPITAL_COMMUNITY): Payer: BLUE CROSS/BLUE SHIELD | Admitting: Psychiatry

## 2017-06-06 ENCOUNTER — Encounter (HOSPITAL_COMMUNITY): Payer: Self-pay | Admitting: Psychiatry

## 2017-06-06 VITALS — BP 124/83 | HR 80 | Ht 73.0 in | Wt 209.0 lb

## 2017-06-06 DIAGNOSIS — F411 Generalized anxiety disorder: Secondary | ICD-10-CM

## 2017-06-06 DIAGNOSIS — F39 Unspecified mood [affective] disorder: Secondary | ICD-10-CM

## 2017-06-06 MED ORDER — HYDROXYZINE HCL 10 MG PO TABS: ORAL_TABLET | ORAL | 0 refills | Status: DC

## 2017-06-06 MED ORDER — ESCITALOPRAM OXALATE 10 MG PO TABS: 10.0000 mg | ORAL_TABLET | Freq: Every day | ORAL | 1 refills | Status: DC

## 2017-06-06 MED ORDER — LAMOTRIGINE 150 MG PO TABS: 150.0000 mg | ORAL_TABLET | Freq: Every day | ORAL | 1 refills | Status: DC
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
## 2017-06-12 ENCOUNTER — Ambulatory Visit (INDEPENDENT_AMBULATORY_CARE_PROVIDER_SITE_OTHER): Payer: BLUE CROSS/BLUE SHIELD

## 2017-06-12 ENCOUNTER — Encounter (INDEPENDENT_AMBULATORY_CARE_PROVIDER_SITE_OTHER): Payer: Self-pay | Admitting: Physician Assistant

## 2017-06-12 ENCOUNTER — Encounter (HOSPITAL_COMMUNITY): Payer: Self-pay | Admitting: Psychology

## 2017-06-12 ENCOUNTER — Other Ambulatory Visit: Payer: BLUE CROSS/BLUE SHIELD

## 2017-06-12 ENCOUNTER — Ambulatory Visit (INDEPENDENT_AMBULATORY_CARE_PROVIDER_SITE_OTHER): Payer: BLUE CROSS/BLUE SHIELD | Admitting: Physician Assistant

## 2017-06-12 DIAGNOSIS — M7712 Lateral epicondylitis, left elbow: Secondary | ICD-10-CM | POA: Diagnosis not present

## 2017-06-12 DIAGNOSIS — M25522 Pain in left elbow: Secondary | ICD-10-CM

## 2017-06-12 DIAGNOSIS — Z79899 Other long term (current) drug therapy: Secondary | ICD-10-CM | POA: Diagnosis not present

## 2017-06-12 NOTE — Progress Notes (Signed)
Henry ChamberlainJason Scheunemann is a 37 y.o. male patient discharged from counseling as last seen on 08/02/16 .  Outpatient Therapist Discharge Summary  Henry ChamberlainJason Verret    [birth year]   Admission Date: 01/18/16   Discharge Date:  06/12/17 Reason for Discharge:  Not active w/ counseling Diagnosis:  GAD Comments:  Pt will continue w/ Dr. Epimenio FootArfeen  Nimrat Woolworth Harrison MonsM [place] Kotarski          Kamyia Thomason, [facility]

## 2017-06-12 NOTE — Progress Notes (Signed)
Office Visit Note   Patient: Henry ChamberlainJason Collins           Date of Birth: 1980-09-25           MRN: [id number] Visit Date: 06/12/2017              Requested by: Henry Collins, Henry Collins [address] [place] [id number] PCP: Henry Collins, Henry Collins   Assessment & Plan: Visit Diagnoses:  1. Pain in left elbow   2. Lateral epicondylitis, left elbow     Plan: We will send him formal physical therapy for the left elbow lateral epicondylitis.  He does not wish to take an oral NSAID therefore recommended natural anti-inflammatories such as to Tumeric.  He will follow-up with Henry Collins on as-needed basis pain persist or becomes worse.  Questions encouraged and answered.  Follow-Up Instructions: Return if symptoms worsen or fail to improve.   Orders:  Orders Placed This Encounter  Procedures  . XR Elbow 2 Views Left   No orders of the defined types were placed in this encounter.     Procedures: No procedures performed   Clinical Data: No additional findings.   Subjective: Chief Complaint  Patient presents with  . Left Elbow - Pain    HPI Henry CowerJason 37 year old male who comes in today for left elbow pain.  Has had increased pain over the last week.  He states he is having difficulty even steering the car due to the pain.  He is having no real radicular symptoms down the arm.  He has had no particular injury.  Pain is worse with lifting objects.  States he was seen by Dr. Jorge Collins many years ago for the same elbow and same problem was given a cortisone injection.  He does not wish to have a cortisone injection today.  He also has a remote history of distal humerus injury as a child.  He did like x-rays to see if he has arthritis in the elbow. Review of Systems Review of systems negative outside of HPI please see HPI  Objective: Vital Signs: There were no vitals taken for this visit.  Physical Exam General: Well-developed well-nourished male in no acute distress. Psych: Alert and oriented  x3  Ortho Exam Radial pulses are intact bilaterally.  Is full motor and full sensation bilateral hands.  Left forearm he has full supination pronation.  He has full extension of the elbow.  Does have limited flexion of the elbow.  Tenderness over the left lateral epicondyle.  Stressing of the elbow with extension of the wrist against resistance causes pain lateral aspect of his elbow.  There is no rashes skin lesions ulcerations erythema. Specialty Comments:  No specialty comments available.  Imaging: Xr Elbow 2 Views Left  Result Date: 06/12/2017 AP lateral views left elbow: Elbow joint is well-maintained.  No acute fractures.  Posttraumatic changes of the distal humerus shaft are noted.    PMFS History: Patient Active Problem List   Diagnosis Date Noted  . 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 Seasonal allergic rhinitis 04/03/2017  . Recurrent sinusitis 03/30/2017  . Need for Tdap vaccination 02/20/2017  . Elevated LDL cholesterol level 02/20/2017  . Low HDL (under 40) 02/20/2017  . Healthcare maintenance 01/12/2017  . Blurred vision 01/12/2017  . Anxiety 01/12/2017  . HEMORRHOIDS, INTERNAL 09/24/2009  . GERD 09/24/2009  . RECTAL BLEEDING 09/24/2009   Past Medical History:  Diagnosis Date  . Allergy   . Anxiety   . Arthritis   .  GERD (gastroesophageal reflux disease)     Family History  Problem Relation Age of Onset  . Depression Mother   . Anxiety disorder Mother   . Alcohol abuse Mother   . Breast cancer Mother   . Diabetes Maternal Grandfather   . Breast cancer Maternal Grandmother   . Hypertension Paternal Grandfather   . Hypertension Father     Past Surgical History:  Procedure Laterality Date  . ARTERIAL THROMBECTOMY Right    Accident at work  . WRIST SURGERY Right    Social History   Occupational History  . Occupation: Maintance  Tobacco Use  . Smoking status: Former Smoker    Packs/day: 1.00    Years: 13.00    Pack years: 13.00    Types:  Cigarettes    Last attempt to quit: 06/30/2008    Years since quitting: 8.9  . Smokeless tobacco: Never Used  Substance and Sexual Activity  . Alcohol use: No  . Drug use: No  . Sexual activity: Not Currently    Birth control/protection: None

## 2017-06-13 LAB — ALT: ALT: 56 IU/L — AB (ref 0–44)

## 2017-06-25 ENCOUNTER — Other Ambulatory Visit (HOSPITAL_COMMUNITY): Payer: Self-pay | Admitting: Psychiatry

## 2017-06-25 DIAGNOSIS — F39 Unspecified mood [affective] disorder: Secondary | ICD-10-CM

## 2017-07-05 NOTE — Progress Notes (Signed)
Received Epic notification that pt has not read MyChart message regarding results.     Recent lab results   From Stan HeadNelson, Tonya S, CMA To [name] Sent 06/13/2017 8:45 AM  Dear Mr. Henry Collins,   Orpha BurKaty asked that I share with you that your ALT (liver function test) has improved and is almost back to your baseline.  Please continue with your current medications and avoid Acetaminophen (Tylenol) and alcohol use.   Wishing you well,  Archie Pattenonya, CMA for  William HamburgerKaty Danford, NP     [address]   MyChart User Last Read On  [name] Not Read      Letter mailed to patient.   Tiajuana Amass. Nelson, CMA

## 2017-07-08 ENCOUNTER — Other Ambulatory Visit (HOSPITAL_COMMUNITY): Payer: Self-pay | Admitting: Psychiatry

## 2017-07-08 DIAGNOSIS — F411 Generalized anxiety disorder: Secondary | ICD-10-CM

## 2017-07-08 DIAGNOSIS — F39 Unspecified mood [affective] disorder: Secondary | ICD-10-CM

## 2017-07-13 ENCOUNTER — Other Ambulatory Visit (HOSPITAL_COMMUNITY): Payer: Self-pay | Admitting: Psychiatry

## 2017-07-13 DIAGNOSIS — F411 Generalized anxiety disorder: Secondary | ICD-10-CM

## 2017-07-19 NOTE — Telephone Encounter (Signed)
A one time refill of patient's prescribed Lamictal 150 mg approved by Dr. Lucianne MussKumar while Dr. Lolly MustacheArfeen is out on medical leave.  Patient to return to see provider 09/06/17

## 2017-07-19 NOTE — Telephone Encounter (Signed)
Dr. Lucianne MussKumar, helping to cover for Dr. Lolly MustacheArfeen out at this time, authorized a one time refill of patient's ordered Vistaril 10 mg, 1-2 a day. Medication e-scribed to patient's pharmacy as approved.

## 2017-08-08 ENCOUNTER — Other Ambulatory Visit (HOSPITAL_COMMUNITY): Payer: Self-pay | Admitting: Psychiatry

## 2017-08-08 DIAGNOSIS — F411 Generalized anxiety disorder: Secondary | ICD-10-CM

## 2017-08-23 ENCOUNTER — Other Ambulatory Visit (HOSPITAL_COMMUNITY): Payer: Self-pay

## 2017-08-23 DIAGNOSIS — F411 Generalized anxiety disorder: Secondary | ICD-10-CM

## 2017-08-23 MED ORDER — ESCITALOPRAM OXALATE 10 MG PO TABS: 10.0000 mg | ORAL_TABLET | Freq: Every day | ORAL | 0 refills | Status: DC

## 2017-09-06 ENCOUNTER — Ambulatory Visit (HOSPITAL_COMMUNITY): Payer: Self-pay | Admitting: Psychiatry

## 2017-09-06 ENCOUNTER — Ambulatory Visit (HOSPITAL_COMMUNITY): Payer: BLUE CROSS/BLUE SHIELD | Admitting: Psychiatry

## 2017-09-06 ENCOUNTER — Encounter (HOSPITAL_COMMUNITY): Payer: Self-pay | Admitting: Psychiatry

## 2017-09-06 VITALS — BP 122/70 | HR 71 | Ht 73.0 in | Wt 211.0 lb

## 2017-09-06 DIAGNOSIS — G479 Sleep disorder, unspecified: Secondary | ICD-10-CM | POA: Diagnosis not present

## 2017-09-06 DIAGNOSIS — Z818 Family history of other mental and behavioral disorders: Secondary | ICD-10-CM

## 2017-09-06 DIAGNOSIS — R451 Restlessness and agitation: Secondary | ICD-10-CM | POA: Diagnosis not present

## 2017-09-06 DIAGNOSIS — F419 Anxiety disorder, unspecified: Secondary | ICD-10-CM | POA: Diagnosis not present

## 2017-09-06 DIAGNOSIS — F39 Unspecified mood [affective] disorder: Secondary | ICD-10-CM | POA: Insufficient documentation

## 2017-09-06 DIAGNOSIS — G4719 Other hypersomnia: Secondary | ICD-10-CM

## 2017-09-06 DIAGNOSIS — Z87891 Personal history of nicotine dependence: Secondary | ICD-10-CM

## 2017-09-06 DIAGNOSIS — Z812 Family history of tobacco abuse and dependence: Secondary | ICD-10-CM

## 2017-09-06 MED ORDER — LAMOTRIGINE 100 MG PO TABS: 100.0000 mg | ORAL_TABLET | Freq: Every day | ORAL | 0 refills | Status: DC

## 2017-09-06 MED ORDER — FLUOXETINE HCL 20 MG PO TABS: ORAL_TABLET | ORAL | 2 refills | Status: DC

## 2017-09-06 NOTE — Patient Instructions (Addendum)
  Start Fluoxetine 40 mg daily for one week.  Continue Lexapro 5 mg until run out, then stop.  Increase Fluoxetine to 60 mg daily for one week.  Change to Lamictal 100 mg in the afternoon.
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
## 2017-09-20 ENCOUNTER — Other Ambulatory Visit (HOSPITAL_COMMUNITY): Payer: Self-pay | Admitting: Psychiatry

## 2017-09-20 DIAGNOSIS — F411 Generalized anxiety disorder: Secondary | ICD-10-CM

## 2017-10-03 ENCOUNTER — Other Ambulatory Visit (HOSPITAL_COMMUNITY): Payer: Self-pay | Admitting: Psychiatry

## 2017-10-03 DIAGNOSIS — F39 Unspecified mood [affective] disorder: Secondary | ICD-10-CM

## 2017-10-12 ENCOUNTER — Other Ambulatory Visit: Payer: Self-pay

## 2017-10-12 ENCOUNTER — Ambulatory Visit (INDEPENDENT_AMBULATORY_CARE_PROVIDER_SITE_OTHER): Payer: BLUE CROSS/BLUE SHIELD | Admitting: Psychiatry

## 2017-10-12 ENCOUNTER — Encounter (HOSPITAL_COMMUNITY): Payer: Self-pay | Admitting: Psychiatry

## 2017-10-12 VITALS — BP 135/84 | HR 76 | Resp 16 | Wt 201.0 lb

## 2017-10-12 DIAGNOSIS — F411 Generalized anxiety disorder: Secondary | ICD-10-CM | POA: Diagnosis not present

## 2017-10-12 DIAGNOSIS — Z87891 Personal history of nicotine dependence: Secondary | ICD-10-CM

## 2017-10-12 DIAGNOSIS — F39 Unspecified mood [affective] disorder: Secondary | ICD-10-CM | POA: Diagnosis not present

## 2017-10-12 MED ORDER — ESCITALOPRAM OXALATE 20 MG PO TABS: 20.0000 mg | ORAL_TABLET | Freq: Every day | ORAL | 1 refills | Status: DC
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
## 2017-11-13 DIAGNOSIS — H00021 Hordeolum internum right upper eyelid: Secondary | ICD-10-CM | POA: Diagnosis not present

## 2017-11-16 ENCOUNTER — Other Ambulatory Visit (HOSPITAL_COMMUNITY): Payer: Self-pay | Admitting: Psychiatry

## 2017-11-16 DIAGNOSIS — F411 Generalized anxiety disorder: Secondary | ICD-10-CM

## 2017-11-29 ENCOUNTER — Ambulatory Visit (HOSPITAL_COMMUNITY): Payer: Self-pay | Admitting: Psychiatry

## 2017-12-12 ENCOUNTER — Ambulatory Visit (HOSPITAL_COMMUNITY): Payer: BLUE CROSS/BLUE SHIELD | Admitting: Psychiatry

## 2017-12-12 ENCOUNTER — Encounter (HOSPITAL_COMMUNITY): Payer: Self-pay | Admitting: Psychiatry

## 2017-12-12 VITALS — BP 122/70 | HR 68 | Ht 73.0 in | Wt 206.0 lb

## 2017-12-12 DIAGNOSIS — F411 Generalized anxiety disorder: Secondary | ICD-10-CM

## 2017-12-12 DIAGNOSIS — F39 Unspecified mood [affective] disorder: Secondary | ICD-10-CM | POA: Diagnosis not present

## 2017-12-12 MED ORDER — FLUOXETINE HCL 20 MG PO TABS: 40.0000 mg | ORAL_TABLET | Freq: Every day | ORAL | 1 refills | Status: DC

## 2017-12-12 MED ORDER — LAMOTRIGINE 100 MG PO TABS: ORAL_TABLET | ORAL | 1 refills | Status: DC
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
## 2018-02-15 ENCOUNTER — Encounter (HOSPITAL_COMMUNITY): Payer: Self-pay | Admitting: Psychiatry

## 2018-02-15 ENCOUNTER — Ambulatory Visit (HOSPITAL_COMMUNITY): Payer: BLUE CROSS/BLUE SHIELD | Admitting: Psychiatry

## 2018-02-15 VITALS — BP 128/76 | Ht 73.0 in | Wt 207.0 lb

## 2018-02-15 DIAGNOSIS — F411 Generalized anxiety disorder: Secondary | ICD-10-CM | POA: Diagnosis not present

## 2018-02-15 DIAGNOSIS — F39 Unspecified mood [affective] disorder: Secondary | ICD-10-CM

## 2018-02-15 MED ORDER — FLUOXETINE HCL 40 MG PO CAPS: 40.0000 mg | ORAL_CAPSULE | Freq: Every day | ORAL | 0 refills | Status: DC
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
## 2018-02-26 ENCOUNTER — Ambulatory Visit (HOSPITAL_COMMUNITY): Payer: BLUE CROSS/BLUE SHIELD | Admitting: Psychiatry

## 2018-02-28 ENCOUNTER — Ambulatory Visit: Payer: BLUE CROSS/BLUE SHIELD | Admitting: Adult Health

## 2018-05-16 ENCOUNTER — Ambulatory Visit (INDEPENDENT_AMBULATORY_CARE_PROVIDER_SITE_OTHER): Payer: BLUE CROSS/BLUE SHIELD | Admitting: Psychiatry

## 2018-05-16 ENCOUNTER — Other Ambulatory Visit: Payer: Self-pay

## 2018-05-16 ENCOUNTER — Encounter (HOSPITAL_COMMUNITY): Payer: Self-pay | Admitting: Psychiatry

## 2018-05-16 DIAGNOSIS — F411 Generalized anxiety disorder: Secondary | ICD-10-CM | POA: Diagnosis not present

## 2018-05-16 DIAGNOSIS — F39 Unspecified mood [affective] disorder: Secondary | ICD-10-CM | POA: Diagnosis not present

## 2018-05-16 MED ORDER — FLUOXETINE HCL 40 MG PO CAPS: 40.0000 mg | ORAL_CAPSULE | Freq: Every day | ORAL | 0 refills | Status: DC
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
## 2018-06-28 ENCOUNTER — Telehealth (HOSPITAL_COMMUNITY): Payer: Self-pay

## 2018-06-28 NOTE — Telephone Encounter (Signed)
Patient called and would like to start back on his Lamictal. He states that now that he has been off of it, he understands what it was doing to help him. Please review and advise, thank you

## 2018-08-10 ENCOUNTER — Ambulatory Visit (INDEPENDENT_AMBULATORY_CARE_PROVIDER_SITE_OTHER): Payer: BC Managed Care – PPO | Admitting: Family Medicine

## 2018-08-10 ENCOUNTER — Encounter: Payer: Self-pay | Admitting: Family Medicine

## 2018-08-10 ENCOUNTER — Other Ambulatory Visit: Payer: Self-pay

## 2018-08-10 DIAGNOSIS — H9192 Unspecified hearing loss, left ear: Secondary | ICD-10-CM

## 2018-08-10 NOTE — Progress Notes (Signed)
   Office Visit Note   Patient: Henry Collins           Date of Birth: [birth year]           MRN: [id number] Visit Date: 08/10/2018 Requested by: Esaw Grandchild, NP [place],  [place] [address] PCP: Esaw Grandchild, NP  Subjective: Chief Complaint  Patient presents with  . ear pain    HPI: He is here with left ear pain and hearing loss.  Symptoms started about a month ago.  He has not felt sick, congested.  He has not had any fever chills or ear drainage.  He uses Q-tips in his ears but has numbness for a long time.  Denies any ringing in his ear.               ROS:   All other systems were reviewed and are negative.  Objective: Vital Signs: There were no vitals taken for this visit.  Physical Exam:  General:  Alert and oriented, in no acute distress. Pulm:  Breathing unlabored. Psy:  Normal mood, congruent affect.  Right ear: Some scarring on the  tympanic membrane but otherwise normal. Left ear: Similar scarring, I believe he has a small perforation on the posterior aspect.  Imaging: None today.  Assessment & Plan: 1.  Left ear pain and hearing loss, possible perforated tympanic membrane -ENT consult.  Follow-up as needed.     Procedures: No procedures performed  No notes on file     PMFS History: Patient Active Problem List   Diagnosis Date Noted  . Episodic mood disorder ([place]) 09/06/2017  . 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 Seasonal allergic rhinitis 04/03/2017  . Recurrent sinusitis 03/30/2017  . Need for Tdap vaccination 02/20/2017  . Elevated LDL cholesterol level 02/20/2017  . Low HDL (under 40) 02/20/2017  . Healthcare maintenance 01/12/2017  . Blurred vision 01/12/2017  . Anxiety 01/12/2017  . HEMORRHOIDS, INTERNAL 09/24/2009  . GERD 09/24/2009  . RECTAL BLEEDING 09/24/2009   Past Medical History:  Diagnosis Date  . Allergy   . Anxiety   . Arthritis   . GERD (gastroesophageal reflux disease)     Family History   Problem Relation Age of Onset  . Depression Mother   . Anxiety disorder Mother   . Alcohol abuse Mother   . Breast cancer Mother   . Diabetes Maternal Grandfather   . Breast cancer Maternal Grandmother   . Hypertension Paternal Grandfather   . Hypertension Father     Past Surgical History:  Procedure Laterality Date  . ARTERIAL THROMBECTOMY Right    Accident at work  . WRIST SURGERY Right    Social History   Occupational History  . Occupation: Maintance  Tobacco Use  . Smoking status: Former Smoker    Packs/day: 1.00    Years: 13.00    Pack years: 13.00    Types: Cigarettes    Quit date: 06/30/2008    Years since quitting: 10.1  . Smokeless tobacco: Never Used  Substance and Sexual Activity  . Alcohol use: No  . Drug use: No  . Sexual activity: Not Currently    Birth control/protection: None

## 2018-08-16 ENCOUNTER — Ambulatory Visit (INDEPENDENT_AMBULATORY_CARE_PROVIDER_SITE_OTHER): Payer: BC Managed Care – PPO | Admitting: Psychiatry

## 2018-08-16 ENCOUNTER — Other Ambulatory Visit: Payer: Self-pay

## 2018-08-16 ENCOUNTER — Encounter (HOSPITAL_COMMUNITY): Payer: Self-pay | Admitting: Psychiatry

## 2018-08-16 DIAGNOSIS — F411 Generalized anxiety disorder: Secondary | ICD-10-CM

## 2018-08-16 DIAGNOSIS — F39 Unspecified mood [affective] disorder: Secondary | ICD-10-CM | POA: Diagnosis not present

## 2018-08-16 MED ORDER — FLUOXETINE HCL 40 MG PO CAPS: 40.0000 mg | ORAL_CAPSULE | Freq: Every day | ORAL | 0 refills | Status: DC

## 2018-08-16 MED ORDER — LAMOTRIGINE 25 MG PO TABS: ORAL_TABLET | ORAL | 2 refills | Status: DC
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
## 2018-08-21 DIAGNOSIS — H93232 Hyperacusis, left ear: Secondary | ICD-10-CM | POA: Diagnosis not present

## 2018-08-21 DIAGNOSIS — H60333 Swimmer's ear, bilateral: Secondary | ICD-10-CM | POA: Diagnosis not present

## 2018-11-05 ENCOUNTER — Other Ambulatory Visit (HOSPITAL_COMMUNITY): Payer: Self-pay | Admitting: Psychiatry

## 2018-11-05 DIAGNOSIS — F39 Unspecified mood [affective] disorder: Secondary | ICD-10-CM

## 2018-11-10 ENCOUNTER — Other Ambulatory Visit (HOSPITAL_COMMUNITY): Payer: Self-pay | Admitting: Psychiatry

## 2018-11-10 DIAGNOSIS — F39 Unspecified mood [affective] disorder: Secondary | ICD-10-CM

## 2018-11-10 DIAGNOSIS — J0100 Acute maxillary sinusitis, unspecified: Secondary | ICD-10-CM | POA: Diagnosis not present

## 2018-11-16 ENCOUNTER — Ambulatory Visit (INDEPENDENT_AMBULATORY_CARE_PROVIDER_SITE_OTHER): Payer: BC Managed Care – PPO | Admitting: Psychiatry

## 2018-11-16 ENCOUNTER — Encounter (HOSPITAL_COMMUNITY): Payer: Self-pay | Admitting: Psychiatry

## 2018-11-16 ENCOUNTER — Other Ambulatory Visit: Payer: Self-pay

## 2018-11-16 DIAGNOSIS — F411 Generalized anxiety disorder: Secondary | ICD-10-CM

## 2018-11-16 DIAGNOSIS — F39 Unspecified mood [affective] disorder: Secondary | ICD-10-CM

## 2018-11-16 MED ORDER — LAMOTRIGINE 25 MG PO TABS: ORAL_TABLET | ORAL | 2 refills | Status: DC

## 2018-11-16 MED ORDER — FLUOXETINE HCL 40 MG PO CAPS: 40.0000 mg | ORAL_CAPSULE | Freq: Every day | ORAL | 0 refills | Status: DC
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
## 2018-11-20 ENCOUNTER — Other Ambulatory Visit: Payer: Self-pay

## 2018-11-20 ENCOUNTER — Other Ambulatory Visit: Payer: Self-pay | Admitting: Physician Assistant

## 2018-11-20 ENCOUNTER — Ambulatory Visit (INDEPENDENT_AMBULATORY_CARE_PROVIDER_SITE_OTHER): Payer: BC Managed Care – PPO | Admitting: Orthopaedic Surgery

## 2018-11-20 ENCOUNTER — Encounter (HOSPITAL_COMMUNITY): Payer: Self-pay | Admitting: *Deleted

## 2018-11-20 ENCOUNTER — Encounter: Payer: Self-pay | Admitting: Orthopaedic Surgery

## 2018-11-20 ENCOUNTER — Other Ambulatory Visit (HOSPITAL_COMMUNITY)
Admission: RE | Admit: 2018-11-20 | Discharge: 2018-11-20 | Disposition: A | Discharge status: Home / Self Care | Payer: BC Managed Care – PPO | Source: Ambulatory Visit | Attending: Orthopedic Surgery | Admitting: Orthopedic Surgery

## 2018-11-20 ENCOUNTER — Ambulatory Visit: Payer: Self-pay

## 2018-11-20 DIAGNOSIS — Z01812 Encounter for preprocedural laboratory examination: Secondary | ICD-10-CM | POA: Insufficient documentation

## 2018-11-20 DIAGNOSIS — Z20828 Contact with and (suspected) exposure to other viral communicable diseases: Secondary | ICD-10-CM | POA: Diagnosis not present

## 2018-11-20 DIAGNOSIS — M79671 Pain in right foot: Secondary | ICD-10-CM | POA: Diagnosis not present

## 2018-11-20 DIAGNOSIS — S92531B Displaced fracture of distal phalanx of right lesser toe(s), initial encounter for open fracture: Secondary | ICD-10-CM | POA: Diagnosis not present

## 2018-11-20 LAB — SARS CORONAVIRUS 2 (TAT 6-24 HRS): SARS Coronavirus 2: NEGATIVE

## 2018-11-20 NOTE — Progress Notes (Signed)
Spoke with pt for pre-op call. Pt denies cardiac history, HTN or Diabetes.  Pt had his Covid test done today. States he is in [employer].  ERAS ordered for pt, pt unable to pick up the Pre-surgery Ensure. Instructed him to not eat food after midnight, but may have clear liquids until 10:50 AM ( 3 hours prior to surgery). Pt voiced understanding.

## 2018-11-20 NOTE — Progress Notes (Signed)
Office Visit Note   Patient: Henry Collins           Date of Birth: 13-Jan-1980           MRN: [id number] Visit Date: 11/20/2018              Requested by: Esaw Grandchild, NP [place],   [id number] PCP: Esaw Grandchild, NP   Assessment & Plan: Visit Diagnoses:  1. Pain in right foot   2. Displaced fracture of distal phalanx of right lesser toe(s), initial encounter for open fracture     Plan: Open reduction internal fixation with irrigation and debridement of right second toe and excision of right toenail by Dr. Sharol Given tomorrow.  Questions encouraged and answered by Dr. Sharol Given to of the patient.  He will follow-up with Dr. Sharol Given  approximately 2 weeks postop.  Follow-Up Instructions: Return 2 weeks postop Dr. Sharol Given.   Orders:  Orders Placed This Encounter  Procedures  . XR Toe 2nd Right   No orders of the defined types were placed in this encounter.     Procedures: No procedures performed   Clinical Data: No additional findings.   Subjective: Chief Complaint  Patient presents with  . Right Foot - Injury    HPI Henry Collins 38 year old male well-known to Dr. Ninfa Linden service comes in today due to right second toe pain after dropping a ladder on it at his home.  Had swelling bruising and foot.  Has had drainage from foot.  No other injury. Review of Systems No fevers chills shortness of breath chest pain.  Objective: Vital Signs: There were no vitals taken for this visit.  Physical Exam Constitutional:      Appearance: He is not ill-appearing or diaphoretic.  Pulmonary:     Effort: Pulmonary effort is normal.  Neurological:     Mental Status: He is alert and oriented to person, place, and time.  Psychiatric:        Behavior: Behavior normal.     Ortho Exam Right foot dorsal pedal pulse intact.  Right second toe ecchymosis abrasions skin.  Laceration consistent with open fracture no exposed bone currently.  Benefits nontender. Specialty  Comments:  No specialty comments available.  Imaging: Xr Toe 2nd Right  Result Date: 11/20/2018 Right second toe 3 views: Tuft fracture with dorsal displacement of the distal fragment.  No other fractures identified.    PMFS History: Patient Active Problem List   Diagnosis Date Noted  . Episodic mood disorder ([place]) 09/06/2017  . 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 Seasonal allergic rhinitis 04/03/2017  . Recurrent sinusitis 03/30/2017  . Need for Tdap vaccination 02/20/2017  . Elevated LDL cholesterol level 02/20/2017  . Low HDL (under 40) 02/20/2017  . Healthcare maintenance 01/12/2017  . Blurred vision 01/12/2017  . Anxiety 01/12/2017  . HEMORRHOIDS, INTERNAL 09/24/2009  . GERD 09/24/2009  . RECTAL BLEEDING 09/24/2009   Past Medical History:  Diagnosis Date  . Allergy   . Anxiety   . Arthritis   . GERD (gastroesophageal reflux disease)     Family History  Problem Relation Age of Onset  . Depression Mother   . Anxiety disorder Mother   . Alcohol abuse Mother   . Breast cancer Mother   . Diabetes Maternal Grandfather   . Breast cancer Maternal Grandmother   . Hypertension Paternal Grandfather   . Hypertension Father     Past Surgical History:  Procedure Laterality Date  . ARTERIAL THROMBECTOMY  Right    Accident at work  . WRIST SURGERY Right    Social History   Occupational History  . Occupation: Maintance  Tobacco Use  . Smoking status: Former Smoker    Packs/day: 1.00    Years: 13.00    Pack years: 13.00    Types: Cigarettes    Quit date: 06/30/2008    Years since quitting: 10.3  . Smokeless tobacco: Never Used  Substance and Sexual Activity  . Alcohol use: No  . Drug use: No  . Sexual activity: Not Currently    Birth control/protection: None

## 2018-11-21 ENCOUNTER — Ambulatory Visit (HOSPITAL_COMMUNITY): Payer: BC Managed Care – PPO | Admitting: Vascular Surgery

## 2018-11-21 ENCOUNTER — Other Ambulatory Visit: Payer: Self-pay

## 2018-11-21 ENCOUNTER — Encounter (HOSPITAL_COMMUNITY)
Admission: RE | Disposition: A | Discharge status: Home / Self Care | Payer: Self-pay | Source: Home / Self Care | Attending: Orthopedic Surgery

## 2018-11-21 ENCOUNTER — Encounter (HOSPITAL_COMMUNITY): Payer: Self-pay | Admitting: *Deleted

## 2018-11-21 ENCOUNTER — Ambulatory Visit (HOSPITAL_COMMUNITY)
Admission: RE | Admit: 2018-11-21 | Discharge: 2018-11-21 | Disposition: A | Discharge status: Home / Self Care | Payer: BC Managed Care – PPO | Attending: Orthopedic Surgery | Admitting: Orthopedic Surgery

## 2018-11-21 ENCOUNTER — Other Ambulatory Visit: Payer: Self-pay | Admitting: Physician Assistant

## 2018-11-21 DIAGNOSIS — Z87891 Personal history of nicotine dependence: Secondary | ICD-10-CM | POA: Insufficient documentation

## 2018-11-21 DIAGNOSIS — K219 Gastro-esophageal reflux disease without esophagitis: Secondary | ICD-10-CM | POA: Diagnosis not present

## 2018-11-21 DIAGNOSIS — S92531B Displaced fracture of distal phalanx of right lesser toe(s), initial encounter for open fracture: Secondary | ICD-10-CM | POA: Diagnosis not present

## 2018-11-21 DIAGNOSIS — W228XXA Striking against or struck by other objects, initial encounter: Secondary | ICD-10-CM | POA: Diagnosis not present

## 2018-11-21 DIAGNOSIS — E7800 Pure hypercholesterolemia, unspecified: Secondary | ICD-10-CM | POA: Diagnosis not present

## 2018-11-21 DIAGNOSIS — M1990 Unspecified osteoarthritis, unspecified site: Secondary | ICD-10-CM | POA: Insufficient documentation

## 2018-11-21 DIAGNOSIS — K648 Other hemorrhoids: Secondary | ICD-10-CM | POA: Diagnosis not present

## 2018-11-21 DIAGNOSIS — S92531D Displaced fracture of distal phalanx of right lesser toe(s), subsequent encounter for fracture with routine healing: Secondary | ICD-10-CM

## 2018-11-21 DIAGNOSIS — S92501B Displaced unspecified fracture of right lesser toe(s), initial encounter for open fracture: Secondary | ICD-10-CM | POA: Diagnosis not present

## 2018-11-21 HISTORY — DX: Pure hypercholesterolemia, unspecified: E78.00

## 2018-11-21 HISTORY — PX: ORIF TOE FRACTURE: SHX5032

## 2018-11-21 LAB — HEMOGLOBIN: Hemoglobin: 15.1 g/dL (ref 13.0–17.0)

## 2018-11-21 SURGERY — OPEN REDUCTION INTERNAL FIXATION (ORIF) METATARSAL (TOE) FRACTURE
Anesthesia: General | Site: Toe | Laterality: Right

## 2018-11-21 MED ORDER — ASCRIPTIN 325 MG PO TABS: 1.0000 | ORAL_TABLET | Freq: Every day | ORAL | 2 refills | Status: DC

## 2018-11-21 MED ORDER — BUPIVACAINE HCL (PF) 0.25 % IJ SOLN
INTRAMUSCULAR | Status: DC | PRN
  Administered 2018-11-21: 17 mL

## 2018-11-21 MED ORDER — CHLORHEXIDINE GLUCONATE 4 % EX LIQD: 60.0000 mL | Freq: Once | CUTANEOUS | Status: DC

## 2018-11-21 MED ORDER — MIDAZOLAM HCL 5 MG/5ML IJ SOLN
INTRAMUSCULAR | Status: DC | PRN
  Administered 2018-11-21: 2 mg via INTRAVENOUS

## 2018-11-21 MED ORDER — FENTANYL CITRATE (PF) 100 MCG/2ML IJ SOLN: 25.0000 ug | INTRAMUSCULAR | Status: DC | PRN

## 2018-11-21 MED ORDER — CEFAZOLIN SODIUM-DEXTROSE 2-4 GM/100ML-% IV SOLN
INTRAVENOUS | Status: AC
  Filled 2018-11-21: qty 100

## 2018-11-21 MED ORDER — BUPIVACAINE HCL (PF) 0.25 % IJ SOLN
INTRAMUSCULAR | Status: AC
  Filled 2018-11-21: qty 30

## 2018-11-21 MED ORDER — HYDROCODONE-ACETAMINOPHEN 5-325 MG PO TABS: 1.0000 | ORAL_TABLET | ORAL | 0 refills | Status: DC | PRN

## 2018-11-21 MED ORDER — LIDOCAINE 2% (20 MG/ML) 5 ML SYRINGE
INTRAMUSCULAR | Status: DC | PRN
  Administered 2018-11-21: 100 mg via INTRAVENOUS

## 2018-11-21 MED ORDER — LACTATED RINGERS IV SOLN
INTRAVENOUS | Status: DC
  Administered 2018-11-21: 13:00:00 via INTRAVENOUS

## 2018-11-21 MED ORDER — PROMETHAZINE HCL 25 MG/ML IJ SOLN: 6.2500 mg | INTRAMUSCULAR | Status: DC | PRN

## 2018-11-21 MED ORDER — ONDANSETRON HCL 4 MG/2ML IJ SOLN
INTRAMUSCULAR | Status: DC | PRN
  Administered 2018-11-21: 4 mg via INTRAVENOUS

## 2018-11-21 MED ORDER — CEFAZOLIN SODIUM-DEXTROSE 2-4 GM/100ML-% IV SOLN
2.0000 g | INTRAVENOUS | Status: AC
  Administered 2018-11-21: 2 g via INTRAVENOUS

## 2018-11-21 MED ORDER — PROPOFOL 10 MG/ML IV BOLUS
INTRAVENOUS | Status: DC | PRN
  Administered 2018-11-21: 200 mg via INTRAVENOUS

## 2018-11-21 MED ORDER — ACETAMINOPHEN 500 MG PO TABS: 1000.0000 mg | ORAL_TABLET | Freq: Once | ORAL | Status: DC

## 2018-11-21 MED ORDER — OXYCODONE HCL 5 MG/5ML PO SOLN: 5.0000 mg | Freq: Once | ORAL | Status: AC | PRN

## 2018-11-21 MED ORDER — MIDAZOLAM HCL 2 MG/2ML IJ SOLN
INTRAMUSCULAR | Status: AC
  Filled 2018-11-21: qty 2

## 2018-11-21 MED ORDER — OXYCODONE HCL 5 MG PO TABS
ORAL_TABLET | ORAL | Status: AC
  Filled 2018-11-21: qty 1

## 2018-11-21 MED ORDER — DEXAMETHASONE SODIUM PHOSPHATE 4 MG/ML IJ SOLN
INTRAMUSCULAR | Status: DC | PRN
  Administered 2018-11-21: 10 mg via INTRAVENOUS

## 2018-11-21 MED ORDER — 0.9 % SODIUM CHLORIDE (POUR BTL) OPTIME
TOPICAL | Status: DC | PRN
  Administered 2018-11-21: 14:00:00 1000 mL

## 2018-11-21 MED ORDER — FENTANYL CITRATE (PF) 100 MCG/2ML IJ SOLN
INTRAMUSCULAR | Status: DC | PRN
  Administered 2018-11-21: 100 ug via INTRAVENOUS

## 2018-11-21 MED ORDER — FENTANYL CITRATE (PF) 250 MCG/5ML IJ SOLN
INTRAMUSCULAR | Status: AC
  Filled 2018-11-21: qty 5

## 2018-11-21 MED ORDER — OXYCODONE HCL 5 MG PO TABS
5.0000 mg | ORAL_TABLET | Freq: Once | ORAL | Status: AC | PRN
  Administered 2018-11-21: 15:00:00 5 mg via ORAL

## 2018-11-21 SURGICAL SUPPLY — 54 items
BLADE AVERAGE 25X9 (BLADE) IMPLANT
BLADE MINI RND TIP GREEN BEAV (BLADE) IMPLANT
BNDG COHESIVE 1X5 TAN STRL LF (GAUZE/BANDAGES/DRESSINGS) IMPLANT
BNDG COHESIVE 4X5 TAN STRL (GAUZE/BANDAGES/DRESSINGS) ×2 IMPLANT
BNDG COHESIVE 6X5 TAN STRL LF (GAUZE/BANDAGES/DRESSINGS) IMPLANT
BNDG ESMARK 4X9 LF (GAUZE/BANDAGES/DRESSINGS) ×2 IMPLANT
BNDG GAUZE ELAST 4 BULKY (GAUZE/BANDAGES/DRESSINGS) ×2 IMPLANT
CORD BIPOLAR FORCEPS 12FT (ELECTRODE) ×2 IMPLANT
COTTON STERILE ROLL (GAUZE/BANDAGES/DRESSINGS) IMPLANT
COVER SURGICAL LIGHT HANDLE (MISCELLANEOUS) ×4 IMPLANT
COVER WAND RF STERILE (DRAPES) ×2 IMPLANT
CUFF TOURN SGL QUICK 18X4 (TOURNIQUET CUFF) IMPLANT
CUFF TOURN SGL QUICK 24 (TOURNIQUET CUFF)
CUFF TOURN SGL QUICK 34 (TOURNIQUET CUFF)
CUFF TOURN SGL QUICK 42 (TOURNIQUET CUFF) IMPLANT
CUFF TRNQT CYL 24X4X16.5-23 (TOURNIQUET CUFF) IMPLANT
CUFF TRNQT CYL 34X4.125X (TOURNIQUET CUFF) IMPLANT
DRAPE OEC MINIVIEW 54X84 (DRAPES) IMPLANT
DRAPE U-SHAPE 47X51 STRL (DRAPES) ×2 IMPLANT
DRSG ADAPTIC 3X8 NADH LF (GAUZE/BANDAGES/DRESSINGS) IMPLANT
DRSG EMULSION OIL 3X3 NADH (GAUZE/BANDAGES/DRESSINGS) ×2 IMPLANT
DURAPREP 26ML APPLICATOR (WOUND CARE) ×2 IMPLANT
ELECT REM PT RETURN 9FT ADLT (ELECTROSURGICAL) ×2
ELECTRODE REM PT RTRN 9FT ADLT (ELECTROSURGICAL) ×1 IMPLANT
GAUZE SPONGE 2X2 8PLY STRL LF (GAUZE/BANDAGES/DRESSINGS) IMPLANT
GAUZE SPONGE 4X4 12PLY STRL (GAUZE/BANDAGES/DRESSINGS) IMPLANT
GAUZE SPONGE 4X4 12PLY STRL LF (GAUZE/BANDAGES/DRESSINGS) ×2 IMPLANT
GLOVE BIOGEL PI IND STRL 9 (GLOVE) ×1 IMPLANT
GLOVE BIOGEL PI INDICATOR 9 (GLOVE) ×1
GLOVE SURG ORTHO 9.0 STRL STRW (GLOVE) ×2 IMPLANT
GOWN STRL REUS W/ TWL XL LVL3 (GOWN DISPOSABLE) ×2 IMPLANT
GOWN STRL REUS W/TWL XL LVL3 (GOWN DISPOSABLE) ×2
KIT BASIN OR (CUSTOM PROCEDURE TRAY) ×2 IMPLANT
KIT TURNOVER KIT B (KITS) ×2 IMPLANT
MANIFOLD NEPTUNE II (INSTRUMENTS) ×2 IMPLANT
NEEDLE HYPO 25GX1X1/2 BEV (NEEDLE) IMPLANT
NS IRRIG 1000ML POUR BTL (IV SOLUTION) ×2 IMPLANT
PACK ORTHO EXTREMITY (CUSTOM PROCEDURE TRAY) ×2 IMPLANT
PAD ABD 8X10 STRL (GAUZE/BANDAGES/DRESSINGS) ×2 IMPLANT
PAD ARMBOARD 7.5X6 YLW CONV (MISCELLANEOUS) ×4 IMPLANT
PAD CAST 4YDX4 CTTN HI CHSV (CAST SUPPLIES) IMPLANT
PADDING CAST COTTON 4X4 STRL (CAST SUPPLIES)
SPECIMEN JAR SMALL (MISCELLANEOUS) ×2 IMPLANT
SPONGE GAUZE 2X2 STER 10/PKG (GAUZE/BANDAGES/DRESSINGS)
SUCTION FRAZIER HANDLE 10FR (MISCELLANEOUS)
SUCTION TUBE FRAZIER 10FR DISP (MISCELLANEOUS) IMPLANT
SUT ETHILON 2 0 PSLX (SUTURE) ×4 IMPLANT
SUT VIC AB 2-0 CT1 27 (SUTURE) ×1
SUT VIC AB 2-0 CT1 TAPERPNT 27 (SUTURE) ×1 IMPLANT
SYR CONTROL 10ML LL (SYRINGE) IMPLANT
TOWEL GREEN STERILE (TOWEL DISPOSABLE) ×2 IMPLANT
TOWEL GREEN STERILE FF (TOWEL DISPOSABLE) ×2 IMPLANT
TUBE CONNECTING 12X1/4 (SUCTIONS) IMPLANT
WATER STERILE IRR 1000ML POUR (IV SOLUTION) ×2 IMPLANT

## 2018-11-21 NOTE — Op Note (Signed)
11/21/2018  2:07 PM  PATIENT:  Henry Collins    PRE-OPERATIVE DIAGNOSIS:  Open Right 2nd Toe Fracture  POST-OPERATIVE DIAGNOSIS:  Same  PROCEDURE:  INTERNAL FIXATION RIGHT 2ND TOE  SURGEON:  Newt Minion, MD  PHYSICIAN ASSISTANT:None ANESTHESIA:   General  PREOPERATIVE INDICATIONS:  [name] is a  38 y.o. male with a diagnosis of Open Right 2nd Toe Fracture who failed conservative measures and elected for surgical management.    The risks benefits and alternatives were discussed with the patient preoperatively including but not limited to the risks of infection, bleeding, nerve injury, cardiopulmonary complications, the need for revision surgery, among others, and the patient was willing to proceed.  OPERATIVE IMPLANTS: 0.045 K wire  @ENCIMAGES @  OPERATIVE FINDINGS: C-arm fluoroscopy verified alignment of the tuft right second toe.  OPERATIVE PROCEDURE: Patient was brought the operating room and underwent a general anesthetic.  After adequate levels anesthesia were obtained patient's right lower extremity was prepped using DuraPrep draped into a sterile field a timeout was called.  Patient received a digital block was 16 cc of quarter percent Marcaine plain.  A dorsal incision was made over the open fracture.  Using a Valora Corporal the wound was debrided the wound was irrigated with normal saline.  The fracture was then reduced the germinal matrix flap was elevated from the fracture site and allowed to rest dorsally.  A 0.045 K wire was inserted C-arm fluoroscopy verified alignment.  Xeroform dressing was placed to hold the nail fold open dry dressing was applied patient was taken the PACU in stable condition.    DISCHARGE PLANNING:  Antibiotic duration: Preoperative antibiotics  Weightbearing: Touchdown weightbearing on the right  Pain medication: Prescription for Vicodin  Dressing care/ Wound VAC: Follow-up in 1 week to change the dressing  Ambulatory devices:  Crutches  Discharge to: Home.  Follow-up: In the office 1 week post operative.

## 2018-11-21 NOTE — Anesthesia Procedure Notes (Signed)
Procedure Name: LMA Insertion Date/Time: 11/21/2018 1:28 PM Performed by: Eulas Post, Treyvone Chelf W, CRNA Pre-anesthesia Checklist: Patient identified, Emergency Drugs available, Suction available and Patient being monitored Patient Re-evaluated:Patient Re-evaluated prior to induction Oxygen Delivery Method: Circle system utilized Preoxygenation: Pre-oxygenation with 100% oxygen Induction Type: IV induction Ventilation: Mask ventilation without difficulty LMA: LMA inserted LMA Size: 5.0 Number of attempts: 1 Placement Confirmation: positive ETCO2 and breath sounds checked- equal and bilateral Tube secured with: Tape Dental Injury: Teeth and Oropharynx as per pre-operative assessment

## 2018-11-21 NOTE — Anesthesia Postprocedure Evaluation (Signed)
Anesthesia Post Note  Patient: Henry Collins  Procedure(s) Performed: INTERNAL FIXATION RIGHT 2ND TOE (Right Toe)     Patient location during evaluation: PACU Anesthesia Type: General Level of consciousness: awake and alert Pain management: pain level controlled Vital Signs Assessment: post-procedure vital signs reviewed and stable Respiratory status: spontaneous breathing, nonlabored ventilation, respiratory function stable and patient connected to nasal cannula oxygen Cardiovascular status: blood pressure returned to baseline and stable Postop Assessment: no apparent nausea or vomiting Anesthetic complications: no    Last Vitals:  Vitals:   11/21/18 1517 11/21/18 1530  BP: (!) 154/92 129/84  Pulse: (!) 53 (!) 54  Resp: 11 (!) 9  Temp:    SpO2: 96% 97%    Last Pain:  Vitals:   11/21/18 1530  TempSrc:   PainSc: 0-No pain                 Gavin Faivre P Mally Gavina

## 2018-11-21 NOTE — H&P (Signed)
Henry Collins is an 38 y.o. male.   Chief Complaint: Right 2nd toe pain HPI: Henry Collins 38 year old male well-known to Dr. Magnus Ivan service comes in today due to right second toe pain after dropping a ladder on it at his home.  Had swelling bruising and foot.  Has had drainage from foot.  No other injury.  Past Medical History:  Diagnosis Date  . Allergy   . Anxiety   . Arthritis   . GERD (gastroesophageal reflux disease)   . High cholesterol     Past Surgical History:  Procedure Laterality Date  . ARTERIAL THROMBECTOMY Right    Accident at work  . WISDOM TOOTH EXTRACTION    . WRIST SURGERY Right     Family History  Problem Relation Age of Onset  . Depression Mother   . Anxiety disorder Mother   . Alcohol abuse Mother   . Breast cancer Mother   . Diabetes Maternal Grandfather   . Breast cancer Maternal Grandmother   . Hypertension Paternal Grandfather   . Hypertension Father    Social History:  reports that he quit smoking about 10 years ago. His smoking use included cigarettes. He has a 13.00 pack-year smoking history. He has never used smokeless tobacco. He reports that he does not drink alcohol or use drugs.  Allergies: No Known Allergies  No medications prior to admission.    Results for orders placed or performed during the hospital encounter of 11/20/18 (from the past 48 hour(s))  SARS CORONAVIRUS 2 (TAT 6-24 HRS) Nasopharyngeal Nasopharyngeal Swab     Status: None   Collection Time: 11/20/18 11:51 AM   Specimen: Nasopharyngeal Swab  Result Value Ref Range   SARS Coronavirus 2 NEGATIVE NEGATIVE    Comment: (NOTE) SARS-CoV-2 target nucleic acids are NOT DETECTED. The SARS-CoV-2 RNA is generally detectable in upper and lower respiratory specimens during the acute phase of infection. Negative results do not preclude SARS-CoV-2 infection, do not rule out co-infections with other pathogens, and should not be used as the sole basis for treatment or other patient  management decisions. Negative results must be combined with clinical observations, patient history, and epidemiological information. The expected result is Negative. Fact Sheet for Patients: HairSlick.no Fact Sheet for Healthcare Providers: quierodirigir.com This test is not yet approved or cleared by the [place] FDA and  has been authorized for detection and/or diagnosis of SARS-CoV-2 by FDA under an Emergency Use Authorization (EUA). This EUA will remain  in effect (meaning this test can be used) for the duration of the COVID-19 declaration under Section 56 4(b)(1) of the Act, 21 U.S.C. section 360bbb-3(b)(1), unless the authorization is terminated or revoked sooner. Performed at [facility], [address], [place], [place] [address]    Xr Toe 2nd Right  Result Date: 11/20/2018 Right second toe 3 views: Tuft fracture with dorsal displacement of the distal fragment.  No other fractures identified.   Review of Systems  All other systems reviewed and are negative.   There were no vitals taken for this visit. Physical Exam  Physical Exam Constitutional:      Appearance: He is not ill-appearing or diaphoretic.  Pulmonary:     Effort: Pulmonary effort is normal.  Neurological:     Mental Status: He is alert and oriented to person, place, and time.  Psychiatric:        Behavior: Behavior normal.  Ortho Exam Right foot dorsal pedal pulse intact.  Right second toe ecchymosis abrasions skin.  Laceration  consistent with open fracture no exposed bone currently.  Benefits nontender. Assessment/Plan 1. Pain in right foot   2. Displaced fracture of distal phalanx of right lesser toe(s), initial encounter for open fracture     Plan: Open reduction internal fixation with irrigation and debridement of right second toe and excision of right toenail by Dr. Sharol Given tomorrow.  Questions encouraged and answered by Dr. Sharol Given  to of the patient.  He will follow-up with Dr. Sharol Given  approximately 2 weeks postop.   Bevely Palmer Kanija Remmel, PA 11/21/2018, 7:21 AM

## 2018-11-21 NOTE — Anesthesia Preprocedure Evaluation (Addendum)
Anesthesia Evaluation  Patient identified by MRN, date of birth, ID band Patient awake    Reviewed: Allergy & Precautions, NPO status , Patient's Chart, lab work & pertinent test results  History of Anesthesia Complications Negative for: history of anesthetic complications  Airway Mallampati: II  TM Distance: >3 FB Neck ROM: Full    Dental no notable dental hx.    Pulmonary former smoker,    Pulmonary exam normal        Cardiovascular negative cardio ROS Normal cardiovascular exam     Neuro/Psych Anxiety negative neurological ROS     GI/Hepatic Neg liver ROS, GERD  Controlled,  Endo/Other  negative endocrine ROS  Renal/GU negative Renal ROS  negative genitourinary   Musculoskeletal  (+) Arthritis ,   Abdominal   Peds  Hematology negative hematology ROS (+)   Anesthesia Other Findings Day of surgery medications reviewed with patient.  Reproductive/Obstetrics negative OB ROS                            Anesthesia Physical Anesthesia Plan  ASA: I  Anesthesia Plan: General   Post-op Pain Management:    Induction: Intravenous  PONV Risk Score and Plan: 2 and Treatment may vary due to age or medical condition, Ondansetron, Dexamethasone and Midazolam  Airway Management Planned: LMA  Additional Equipment:   Intra-op Plan:   Post-operative Plan: Extubation in OR  Informed Consent: I have reviewed the patients History and Physical, chart, labs and discussed the procedure including the risks, benefits and alternatives for the proposed anesthesia with the patient or authorized representative who has indicated his/her understanding and acceptance.     Dental advisory given  Plan Discussed with: CRNA  Anesthesia Plan Comments:        Anesthesia Quick Evaluation

## 2018-11-21 NOTE — Transfer of Care (Signed)
Immediate Anesthesia Transfer of Care Note  Patient: Henry Collins  Procedure(s) Performed: INTERNAL FIXATION RIGHT 2ND TOE (Right Toe)  Patient Location: PACU  Anesthesia Type:General  Level of Consciousness: drowsy  Airway & Oxygen Therapy: Patient Spontanous Breathing and Patient connected to face mask oxygen  Post-op Assessment: Report given to RN and Post -op Vital signs reviewed and stable  Post vital signs: Reviewed and stable  Last Vitals:  Vitals Value Taken Time  BP    Temp    Pulse 51 11/21/18 1400  Resp 8 11/21/18 1400  SpO2 98 % 11/21/18 1400  Vitals shown include unvalidated device data.  Last Pain:  Vitals:   11/21/18 1128  TempSrc:   PainSc: 0-No pain      Patients Stated Pain Goal: 0 (49/70/26 3785)  Complications: No apparent anesthesia complications

## 2018-11-22 ENCOUNTER — Encounter (HOSPITAL_COMMUNITY): Payer: Self-pay | Admitting: Orthopedic Surgery

## 2018-12-04 ENCOUNTER — Other Ambulatory Visit: Payer: Self-pay

## 2018-12-04 ENCOUNTER — Encounter: Payer: Self-pay | Admitting: Orthopedic Surgery

## 2018-12-04 ENCOUNTER — Ambulatory Visit (INDEPENDENT_AMBULATORY_CARE_PROVIDER_SITE_OTHER): Payer: BC Managed Care – PPO | Admitting: Orthopedic Surgery

## 2018-12-04 VITALS — Ht 73.0 in | Wt 208.0 lb

## 2018-12-04 DIAGNOSIS — S92531B Displaced fracture of distal phalanx of right lesser toe(s), initial encounter for open fracture: Secondary | ICD-10-CM

## 2018-12-04 NOTE — Progress Notes (Signed)
   Post-Op Visit Note   Patient: Henry Collins           Date of Birth: [birth year]           MRN: [id number] Visit Date: 12/04/2018 PCP: Esaw Grandchild, NP  Chief Complaint:  Chief Complaint  Patient presents with  . Left Foot - Routine Post Op    11/21/18 internal fixation right 2nd toe.     HPI:  HPI Patient is a 38 year old gentleman seen today status post internal fixation right second toe distal phalanx.  Pin is in place he has been full weightbearing in postop shoe without any restrictions.  Is pleased with his progress denies any pain there is no drainage states the pain is loose.  Ortho Exam  Second toe on right foot is straight. Pin in place. Suture in place. Incision is healing well. No erythema, drainage or sign of infection.   Visit Diagnoses: No diagnosis found.  Plan: pin harvested. Suture harvested. Patient to follow up in office in 4 weeks as needed. Stiff walking shoe for next 4 weeks.   Follow-Up Instructions: No follow-ups on file.   Imaging: No results found.  Orders:  No orders of the defined types were placed in this encounter.  No orders of the defined types were placed in this encounter.    PMFS History: Patient Active Problem List   Diagnosis Date Noted  . Open displaced fracture of distal phalanx of lesser toe of right foot   . Episodic mood disorder ([place]) 09/06/2017  . 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 Seasonal allergic rhinitis 04/03/2017  . Recurrent sinusitis 03/30/2017  . Need for Tdap vaccination 02/20/2017  . Elevated LDL cholesterol level 02/20/2017  . Low HDL (under 40) 02/20/2017  . Healthcare maintenance 01/12/2017  . Blurred vision 01/12/2017  . Anxiety 01/12/2017  . HEMORRHOIDS, INTERNAL 09/24/2009  . GERD 09/24/2009  . RECTAL BLEEDING 09/24/2009   Past Medical History:  Diagnosis Date  . Allergy   . Anxiety   . Arthritis   . GERD (gastroesophageal reflux disease)   . High cholesterol     Family History   Problem Relation Age of Onset  . Depression Mother   . Anxiety disorder Mother   . Alcohol abuse Mother   . Breast cancer Mother   . Diabetes Maternal Grandfather   . Breast cancer Maternal Grandmother   . Hypertension Paternal Grandfather   . Hypertension Father     Past Surgical History:  Procedure Laterality Date  . ARTERIAL THROMBECTOMY Right    Accident at work  . ORIF TOE FRACTURE Right 11/21/2018   Procedure: INTERNAL FIXATION RIGHT 2ND TOE;  Surgeon: Newt Minion, MD;  Location: [facility];  Service: Orthopedics;  Laterality: Right;  . WISDOM TOOTH EXTRACTION    . WRIST SURGERY Right    Social History   Occupational History  . Occupation: Maintance  Tobacco Use  . Smoking status: Former Smoker    Packs/day: 1.00    Years: 13.00    Pack years: 13.00    Types: Cigarettes    Quit date: 06/30/2008    Years since quitting: 10.4  . Smokeless tobacco: Never Used  Substance and Sexual Activity  . Alcohol use: No  . Drug use: No  . Sexual activity: Not Currently    Birth control/protection: None

## 2019-02-09 ENCOUNTER — Other Ambulatory Visit (HOSPITAL_COMMUNITY): Payer: Self-pay | Admitting: Psychiatry

## 2019-02-09 DIAGNOSIS — F39 Unspecified mood [affective] disorder: Secondary | ICD-10-CM

## 2019-02-14 ENCOUNTER — Other Ambulatory Visit: Payer: Self-pay

## 2019-02-14 ENCOUNTER — Encounter (HOSPITAL_COMMUNITY): Payer: Self-pay | Admitting: Psychiatry

## 2019-02-14 ENCOUNTER — Ambulatory Visit (INDEPENDENT_AMBULATORY_CARE_PROVIDER_SITE_OTHER): Payer: BC Managed Care – PPO | Admitting: Psychiatry

## 2019-02-14 DIAGNOSIS — F411 Generalized anxiety disorder: Secondary | ICD-10-CM | POA: Diagnosis not present

## 2019-02-14 DIAGNOSIS — F39 Unspecified mood [affective] disorder: Secondary | ICD-10-CM

## 2019-02-14 MED ORDER — LAMOTRIGINE 25 MG PO TABS: ORAL_TABLET | ORAL | 2 refills | Status: DC

## 2019-02-14 MED ORDER — FLUOXETINE HCL 40 MG PO CAPS: 40.0000 mg | ORAL_CAPSULE | Freq: Every day | ORAL | 0 refills | Status: DC
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
## 2019-02-27 ENCOUNTER — Ambulatory Visit: Payer: Self-pay

## 2019-02-27 ENCOUNTER — Ambulatory Visit: Payer: BC Managed Care – PPO | Admitting: Orthopaedic Surgery

## 2019-02-27 ENCOUNTER — Other Ambulatory Visit: Payer: Self-pay

## 2019-02-27 ENCOUNTER — Encounter: Payer: Self-pay | Admitting: Orthopaedic Surgery

## 2019-02-27 DIAGNOSIS — M25512 Pain in left shoulder: Secondary | ICD-10-CM

## 2019-02-27 DIAGNOSIS — M7542 Impingement syndrome of left shoulder: Secondary | ICD-10-CM

## 2019-02-27 MED ORDER — LIDOCAINE HCL 1 % IJ SOLN
3.0000 mL | INTRAMUSCULAR | Status: AC | PRN
  Administered 2019-02-27: 12:00:00 3 mL

## 2019-02-27 MED ORDER — METHYLPREDNISOLONE ACETATE 40 MG/ML IJ SUSP
40.0000 mg | INTRAMUSCULAR | Status: AC | PRN
  Administered 2019-02-27: 12:00:00 40 mg via INTRA_ARTICULAR

## 2019-02-27 NOTE — Progress Notes (Signed)
Office Visit Note   Patient: Henry Collins           Date of Birth: 12-May-1980           MRN: [id number] Visit Date: 02/27/2019              Requested by: Henry Grandchild, NP [place],  [place] [address] PCP: Henry Grandchild, NP   Assessment & Plan: Visit Diagnoses:  1. Left shoulder pain, unspecified chronicity   2. Impingement syndrome of left shoulder     Plan: His signs and symptoms are consistent with a left shoulder impingement syndrome.  I did talk to him about trying a subacromial steroid injection in the left shoulder and he agrees with this treatment plan.  He tolerated the injection well.  All question concerns were answered addressed.  If this continues to bother him my neck step would be to obtain an MRI of the left shoulder.  Follow-Up Instructions: Return if symptoms worsen or fail to improve.   Orders:  Orders Placed This Encounter  Procedures  . Large Joint Inj  . XR Shoulder Left   No orders of the defined types were placed in this encounter.     Procedures: Large Joint Inj: L subacromial bursa on 02/27/2019 11:35 AM Indications: pain and diagnostic evaluation Details: 22 G 1.5 in needle  Arthrogram: No  Medications: 3 mL lidocaine 1 %; 40 mg methylPREDNISolone acetate 40 MG/ML Outcome: tolerated well, no immediate complications Procedure, treatment alternatives, risks and benefits explained, specific risks discussed. Consent was given by the patient. Immediately prior to procedure a time out was called to verify the correct patient, procedure, equipment, support staff and site/side marked as required. Patient was prepped and draped in the usual sterile fashion.       Clinical Data: No additional findings.   Subjective: Chief Complaint  Patient presents with  . Left Shoulder - Pain  Henry Collins comes in today for evaluation treatment of left shoulder pain.  It hurts with overhead activities and reaching behind him.  It has been slowly  getting worse with time.  He is someone who is always try to deal with pain on his own but now this is becoming aggravating to him as he continues to work through the pain and just do his daily activities and daily job in general.  He is never injured the shoulder before or had surgery on it.  He denies any numbness and tingling in his hand but it does radiate down his arm.  It does not cause any neck issues.  He is not a diabetic.  HPI  Review of Systems He currently denies any headache, chest pain, shortness of breath, fever, chills, nausea, vomiting  Objective: Vital Signs: There were no vitals taken for this visit.  Physical Exam He is alert and orient x3 and in no acute distress Ortho Exam Examination of his left shoulder shows he has full and fluid range of motion of the shoulder but is definitely painful when you abduct him past 90 degrees.  His external rotation is full and his internal rotation is full but painful.  He has a negative liftoff.  He does have positive Neer and Hawkins signs. Specialty Comments:  No specialty comments available.  Imaging: XR Shoulder Left  Result Date: 02/27/2019 3 views of the left shoulder show well located shoulder with no acute findings.    PMFS History: Patient Active Problem List   Diagnosis Date Noted  .  Open displaced fracture of distal phalanx of lesser toe of right foot   . Episodic mood disorder (HCC) 09/06/2017  . 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 Seasonal allergic rhinitis 04/03/2017  . Recurrent sinusitis 03/30/2017  . Need for Tdap vaccination 02/20/2017  . Elevated LDL cholesterol level 02/20/2017  . Low HDL (under 40) 02/20/2017  . Healthcare maintenance 01/12/2017  . Blurred vision 01/12/2017  . Anxiety 01/12/2017  . HEMORRHOIDS, INTERNAL 09/24/2009  . GERD 09/24/2009  . RECTAL BLEEDING 09/24/2009   Past Medical History:  Diagnosis Date  . Allergy   . Anxiety   . Arthritis   . GERD (gastroesophageal  reflux disease)   . High cholesterol     Family History  Problem Relation Age of Onset  . Depression Mother   . Anxiety disorder Mother   . Alcohol abuse Mother   . Breast cancer Mother   . Diabetes Maternal Grandfather   . Breast cancer Maternal Grandmother   . Hypertension Paternal Grandfather   . Hypertension Father     Past Surgical History:  Procedure Laterality Date  . ARTERIAL THROMBECTOMY Right    Accident at work  . ORIF TOE FRACTURE Right 11/21/2018   Procedure: INTERNAL FIXATION RIGHT 2ND TOE;  Surgeon: Nadara Mustard, MD;  Location: [facility] OR;  Service: Orthopedics;  Laterality: Right;  . WISDOM TOOTH EXTRACTION    . WRIST SURGERY Right    Social History   Occupational History  . Occupation: Maintance  Tobacco Use  . Smoking status: Former Smoker    Packs/day: 1.00    Years: 13.00    Pack years: 13.00    Types: Cigarettes    Quit date: 06/30/2008    Years since quitting: 10.6  . Smokeless tobacco: Never Used  Substance and Sexual Activity  . Alcohol use: No  . Drug use: No  . Sexual activity: Not Currently    Birth control/protection: None

## 2019-03-18 ENCOUNTER — Other Ambulatory Visit: Payer: Self-pay

## 2019-03-18 DIAGNOSIS — Z96612 Presence of left artificial shoulder joint: Secondary | ICD-10-CM

## 2019-04-04 DIAGNOSIS — M62838 Other muscle spasm: Secondary | ICD-10-CM | POA: Diagnosis not present

## 2019-04-04 DIAGNOSIS — M9902 Segmental and somatic dysfunction of thoracic region: Secondary | ICD-10-CM | POA: Diagnosis not present

## 2019-04-04 DIAGNOSIS — M542 Cervicalgia: Secondary | ICD-10-CM | POA: Diagnosis not present

## 2019-04-04 DIAGNOSIS — M9901 Segmental and somatic dysfunction of cervical region: Secondary | ICD-10-CM | POA: Diagnosis not present

## 2019-04-09 DIAGNOSIS — M9902 Segmental and somatic dysfunction of thoracic region: Secondary | ICD-10-CM | POA: Diagnosis not present

## 2019-04-09 DIAGNOSIS — M9901 Segmental and somatic dysfunction of cervical region: Secondary | ICD-10-CM | POA: Diagnosis not present

## 2019-04-09 DIAGNOSIS — M542 Cervicalgia: Secondary | ICD-10-CM | POA: Diagnosis not present

## 2019-04-09 DIAGNOSIS — M62838 Other muscle spasm: Secondary | ICD-10-CM | POA: Diagnosis not present

## 2019-04-17 DIAGNOSIS — M542 Cervicalgia: Secondary | ICD-10-CM | POA: Diagnosis not present

## 2019-04-17 DIAGNOSIS — M62838 Other muscle spasm: Secondary | ICD-10-CM | POA: Diagnosis not present

## 2019-04-17 DIAGNOSIS — M9902 Segmental and somatic dysfunction of thoracic region: Secondary | ICD-10-CM | POA: Diagnosis not present

## 2019-04-17 DIAGNOSIS — M9901 Segmental and somatic dysfunction of cervical region: Secondary | ICD-10-CM | POA: Diagnosis not present

## 2019-04-22 DIAGNOSIS — M62838 Other muscle spasm: Secondary | ICD-10-CM | POA: Diagnosis not present

## 2019-04-22 DIAGNOSIS — M542 Cervicalgia: Secondary | ICD-10-CM | POA: Diagnosis not present

## 2019-04-22 DIAGNOSIS — M9902 Segmental and somatic dysfunction of thoracic region: Secondary | ICD-10-CM | POA: Diagnosis not present

## 2019-04-22 DIAGNOSIS — M9901 Segmental and somatic dysfunction of cervical region: Secondary | ICD-10-CM | POA: Diagnosis not present

## 2019-04-24 DIAGNOSIS — M9901 Segmental and somatic dysfunction of cervical region: Secondary | ICD-10-CM | POA: Diagnosis not present

## 2019-04-24 DIAGNOSIS — M62838 Other muscle spasm: Secondary | ICD-10-CM | POA: Diagnosis not present

## 2019-04-24 DIAGNOSIS — M542 Cervicalgia: Secondary | ICD-10-CM | POA: Diagnosis not present

## 2019-04-24 DIAGNOSIS — M9902 Segmental and somatic dysfunction of thoracic region: Secondary | ICD-10-CM | POA: Diagnosis not present

## 2019-04-25 DIAGNOSIS — M9902 Segmental and somatic dysfunction of thoracic region: Secondary | ICD-10-CM | POA: Diagnosis not present

## 2019-04-25 DIAGNOSIS — M62838 Other muscle spasm: Secondary | ICD-10-CM | POA: Diagnosis not present

## 2019-04-25 DIAGNOSIS — M542 Cervicalgia: Secondary | ICD-10-CM | POA: Diagnosis not present

## 2019-04-25 DIAGNOSIS — M9901 Segmental and somatic dysfunction of cervical region: Secondary | ICD-10-CM | POA: Diagnosis not present

## 2019-04-29 DIAGNOSIS — M9901 Segmental and somatic dysfunction of cervical region: Secondary | ICD-10-CM | POA: Diagnosis not present

## 2019-04-29 DIAGNOSIS — M62838 Other muscle spasm: Secondary | ICD-10-CM | POA: Diagnosis not present

## 2019-04-29 DIAGNOSIS — M9902 Segmental and somatic dysfunction of thoracic region: Secondary | ICD-10-CM | POA: Diagnosis not present

## 2019-04-29 DIAGNOSIS — M542 Cervicalgia: Secondary | ICD-10-CM | POA: Diagnosis not present

## 2019-05-02 DIAGNOSIS — M62838 Other muscle spasm: Secondary | ICD-10-CM | POA: Diagnosis not present

## 2019-05-02 DIAGNOSIS — M9901 Segmental and somatic dysfunction of cervical region: Secondary | ICD-10-CM | POA: Diagnosis not present

## 2019-05-02 DIAGNOSIS — M9902 Segmental and somatic dysfunction of thoracic region: Secondary | ICD-10-CM | POA: Diagnosis not present

## 2019-05-02 DIAGNOSIS — M542 Cervicalgia: Secondary | ICD-10-CM | POA: Diagnosis not present

## 2019-05-16 ENCOUNTER — Other Ambulatory Visit (HOSPITAL_COMMUNITY): Payer: Self-pay | Admitting: Psychiatry

## 2019-05-16 ENCOUNTER — Other Ambulatory Visit: Payer: Self-pay

## 2019-05-16 ENCOUNTER — Encounter (HOSPITAL_COMMUNITY): Payer: Self-pay | Admitting: Psychiatry

## 2019-05-16 ENCOUNTER — Telehealth (INDEPENDENT_AMBULATORY_CARE_PROVIDER_SITE_OTHER): Payer: BC Managed Care – PPO | Admitting: Psychiatry

## 2019-05-16 DIAGNOSIS — F411 Generalized anxiety disorder: Secondary | ICD-10-CM

## 2019-05-16 DIAGNOSIS — F39 Unspecified mood [affective] disorder: Secondary | ICD-10-CM | POA: Diagnosis not present

## 2019-05-16 MED ORDER — FLUOXETINE HCL 40 MG PO CAPS: 40.0000 mg | ORAL_CAPSULE | Freq: Every day | ORAL | 0 refills | Status: DC

## 2019-05-16 MED ORDER — LAMOTRIGINE 100 MG PO TABS: 100.0000 mg | ORAL_TABLET | Freq: Every day | ORAL | 2 refills | Status: DC
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
## 2019-06-19 ENCOUNTER — Telehealth: Payer: Self-pay | Admitting: Orthopaedic Surgery

## 2019-06-19 ENCOUNTER — Ambulatory Visit
Admission: RE | Admit: 2019-06-19 | Discharge: 2019-06-19 | Disposition: A | Discharge status: Home / Self Care | Payer: BC Managed Care – PPO | Source: Ambulatory Visit | Attending: Orthopaedic Surgery | Admitting: Orthopaedic Surgery

## 2019-06-19 DIAGNOSIS — Z96612 Presence of left artificial shoulder joint: Secondary | ICD-10-CM

## 2019-06-19 DIAGNOSIS — M25512 Pain in left shoulder: Secondary | ICD-10-CM | POA: Diagnosis not present

## 2019-06-19 NOTE — Telephone Encounter (Signed)
Please review MRI of shoulder

## 2019-06-20 NOTE — Telephone Encounter (Signed)
These are Jerimy's MRI results he got yesterday

## 2019-06-21 ENCOUNTER — Ambulatory Visit: Payer: Self-pay

## 2019-06-21 ENCOUNTER — Ambulatory Visit (INDEPENDENT_AMBULATORY_CARE_PROVIDER_SITE_OTHER): Payer: BC Managed Care – PPO | Admitting: Family Medicine

## 2019-06-21 ENCOUNTER — Other Ambulatory Visit: Payer: Self-pay

## 2019-06-21 DIAGNOSIS — M25512 Pain in left shoulder: Secondary | ICD-10-CM

## 2019-06-21 NOTE — Progress Notes (Signed)
Subjective: He is here for a planned ultrasound-guided left shoulder AC joint and glenohumeral injection.  Objective: He is point tender over the [facility] joint.  He has a positive AC crossover test.  He also has some early adhesive capsulitis especially with abduction and external rotation.  Pain at the extreme.  Procedure: Ultrasound-guided left AC joint injection: After sterile prep with Betadine, injected 3 cc 1% lidocaine without epinephrine and 40 mg methylprednisolone, injectate was seen filling the joint capsule.  He experienced a vasovagal reaction during the procedure and this resolved after lying flat for a while.  During the anesthetic phase, he had excellent relief of his pain with improved range of motion.  We elected not to proceed with glenohumeral injection for now, and see how he does with the [facility] joint injection.  If it helps but not enough, he will come back in the future for a glenohumeral injection.

## 2019-07-10 ENCOUNTER — Encounter: Payer: Self-pay | Admitting: Family Medicine

## 2019-07-18 ENCOUNTER — Other Ambulatory Visit: Payer: BC Managed Care – PPO

## 2019-08-09 DIAGNOSIS — U071 COVID-19: Secondary | ICD-10-CM | POA: Diagnosis not present

## 2019-08-14 ENCOUNTER — Other Ambulatory Visit: Payer: Self-pay

## 2019-08-14 ENCOUNTER — Encounter (HOSPITAL_COMMUNITY): Payer: Self-pay | Admitting: Psychiatry

## 2019-08-14 ENCOUNTER — Telehealth (INDEPENDENT_AMBULATORY_CARE_PROVIDER_SITE_OTHER): Payer: BC Managed Care – PPO | Admitting: Psychiatry

## 2019-08-14 VITALS — Wt 208.0 lb

## 2019-08-14 DIAGNOSIS — F39 Unspecified mood [affective] disorder: Secondary | ICD-10-CM | POA: Diagnosis not present

## 2019-08-14 DIAGNOSIS — F411 Generalized anxiety disorder: Secondary | ICD-10-CM | POA: Diagnosis not present

## 2019-08-14 MED ORDER — FLUOXETINE HCL 40 MG PO CAPS: 40.0000 mg | ORAL_CAPSULE | Freq: Every day | ORAL | 0 refills | Status: DC

## 2019-08-14 MED ORDER — LAMOTRIGINE 100 MG PO TABS: 100.0000 mg | ORAL_TABLET | Freq: Every day | ORAL | 0 refills | Status: DC
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
## 2019-09-02 ENCOUNTER — Ambulatory Visit: Payer: BC Managed Care – PPO | Admitting: Physician Assistant

## 2019-09-02 ENCOUNTER — Encounter: Payer: Self-pay | Admitting: Physician Assistant

## 2019-09-02 DIAGNOSIS — M7542 Impingement syndrome of left shoulder: Secondary | ICD-10-CM

## 2019-09-02 DIAGNOSIS — M898X1 Other specified disorders of bone, shoulder: Secondary | ICD-10-CM | POA: Diagnosis not present

## 2019-09-02 NOTE — Progress Notes (Signed)
HPI: Henry Collins comes in today in regards to his left shoulder.  He has had pain in his left shoulder for approximately 10 months.  MRI did show some minimal rotator cuff tendinopathy but no full-thickness or partial tearing of the cuff.  He had marked edema changes in the distal clavicle no significant AC joint arthritic changes.  Thickening of the capsular structure consistent with synovitis or adhesive capsulitis.  Mild subacromial and subdeltoid bursitis.  He underwent an ultrasound guided left AC joint injection and had good results with no pain for 2 weeks.  Now his pain has returned.  He has tenderness he states over the distal clavicle area and pain with overhead activity.  Review of systems see HPI otherwise negative  Physical exam: Left shoulder tenderness over the distal clavicle.  Negative abduction test.  He is able to bring his arm off overhead and abduction but somewhat of a ratcheting motion.  5 out of 5 strength with external and internal rotation against resistance.  Impression: Left shoulder impingement Left shoulder clavicle pain  Plan: Due to patient's failure of conservative treatment which is included time exercises injections and given his MRI findings recommend left shoulder arthroscopy with extensive debridement and subacromial decompression.  Having follow-up 1 week postop.  Risk benefits surgery discussed with patient.  Questions encouraged and answered at length.

## 2019-09-19 ENCOUNTER — Other Ambulatory Visit: Payer: Self-pay | Admitting: Orthopaedic Surgery

## 2019-09-19 DIAGNOSIS — G8918 Other acute postprocedural pain: Secondary | ICD-10-CM | POA: Diagnosis not present

## 2019-09-19 DIAGNOSIS — M7552 Bursitis of left shoulder: Secondary | ICD-10-CM | POA: Diagnosis not present

## 2019-09-19 DIAGNOSIS — M89512 Osteolysis, left shoulder: Secondary | ICD-10-CM | POA: Diagnosis not present

## 2019-09-19 DIAGNOSIS — M7582 Other shoulder lesions, left shoulder: Secondary | ICD-10-CM | POA: Diagnosis not present

## 2019-09-19 DIAGNOSIS — M7542 Impingement syndrome of left shoulder: Secondary | ICD-10-CM | POA: Diagnosis not present

## 2019-09-19 MED ORDER — ONDANSETRON 4 MG PO TBDP: 4.0000 mg | ORAL_TABLET | Freq: Three times a day (TID) | ORAL | 0 refills | Status: DC | PRN

## 2019-09-19 MED ORDER — OXYCODONE HCL 5 MG PO TABS: 5.0000 mg | ORAL_TABLET | ORAL | 0 refills | Status: DC | PRN

## 2019-09-20 ENCOUNTER — Other Ambulatory Visit: Payer: Self-pay | Admitting: Orthopaedic Surgery

## 2019-09-20 MED ORDER — KETOROLAC TROMETHAMINE 10 MG PO TABS: 10.0000 mg | ORAL_TABLET | Freq: Four times a day (QID) | ORAL | 0 refills | Status: DC

## 2019-09-20 MED ORDER — TIZANIDINE HCL 4 MG PO TABS: 4.0000 mg | ORAL_TABLET | Freq: Four times a day (QID) | ORAL | 0 refills | Status: DC | PRN

## 2019-09-26 ENCOUNTER — Other Ambulatory Visit: Payer: Self-pay

## 2019-09-26 ENCOUNTER — Ambulatory Visit (INDEPENDENT_AMBULATORY_CARE_PROVIDER_SITE_OTHER): Payer: BC Managed Care – PPO | Admitting: Physician Assistant

## 2019-09-26 ENCOUNTER — Encounter: Payer: Self-pay | Admitting: Physician Assistant

## 2019-09-26 DIAGNOSIS — Z9889 Other specified postprocedural states: Secondary | ICD-10-CM

## 2019-09-26 NOTE — Progress Notes (Signed)
HPI: Henry Collins returns today 1 week status post left shoulder arthroscopy with subacromial decompression and distal clavicle resection.  He had significant pain particularly the second day postop.  Is been working on range of motion of the shoulder he presents today with a sling on.  He is taking no pain medication at this point time outside despite Tylenol.  Physical exam: Left shoulder port sites are well approximated with nylon sutures no signs of infection.  He is able to abduct and forward flex the shoulder to approximately 90 degrees actively.  He has full range of motion of the left elbow without pain.  Impression: 1 week status post left shoulder arthroscopy with subacromial decompression and distal clavicle resection.  Plan: Sutures removed Steri-Strips applied.  Work on Social research officer, government.  We will send him to formal physical therapy here for range of motion strengthening modalities and home exercise program.  Questions were encouraged and answered at length we will see him back in 4 weeks sooner if there is any concerns.

## 2019-09-30 ENCOUNTER — Other Ambulatory Visit: Payer: Self-pay | Admitting: Radiology

## 2019-09-30 DIAGNOSIS — Z9889 Other specified postprocedural states: Secondary | ICD-10-CM

## 2019-10-28 DIAGNOSIS — J0100 Acute maxillary sinusitis, unspecified: Secondary | ICD-10-CM | POA: Diagnosis not present

## 2019-10-28 DIAGNOSIS — Z20828 Contact with and (suspected) exposure to other viral communicable diseases: Secondary | ICD-10-CM | POA: Diagnosis not present

## 2019-10-28 DIAGNOSIS — R0981 Nasal congestion: Secondary | ICD-10-CM | POA: Diagnosis not present

## 2019-11-14 ENCOUNTER — Encounter (HOSPITAL_COMMUNITY): Payer: Self-pay | Admitting: Psychiatry

## 2019-11-14 ENCOUNTER — Other Ambulatory Visit: Payer: Self-pay

## 2019-11-14 ENCOUNTER — Telehealth (INDEPENDENT_AMBULATORY_CARE_PROVIDER_SITE_OTHER): Payer: BC Managed Care – PPO | Admitting: Psychiatry

## 2019-11-14 DIAGNOSIS — F411 Generalized anxiety disorder: Secondary | ICD-10-CM

## 2019-11-14 DIAGNOSIS — F39 Unspecified mood [affective] disorder: Secondary | ICD-10-CM

## 2019-11-14 MED ORDER — FLUOXETINE HCL 40 MG PO CAPS: 40.0000 mg | ORAL_CAPSULE | Freq: Every day | ORAL | 0 refills | Status: DC

## 2019-11-14 MED ORDER — LAMOTRIGINE 150 MG PO TABS: 150.0000 mg | ORAL_TABLET | Freq: Every day | ORAL | 0 refills | Status: DC
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
## 2019-11-20 ENCOUNTER — Other Ambulatory Visit (HOSPITAL_COMMUNITY): Payer: Self-pay | Admitting: Psychiatry

## 2019-11-20 DIAGNOSIS — F39 Unspecified mood [affective] disorder: Secondary | ICD-10-CM

## 2020-01-24 ENCOUNTER — Telehealth: Payer: Self-pay | Admitting: Family Medicine

## 2020-01-24 MED ORDER — AZITHROMYCIN 250 MG PO TABS: ORAL_TABLET | ORAL | 0 refills | Status: DC

## 2020-01-24 NOTE — Telephone Encounter (Signed)
Left patient a voice mail advising him a zpack was sent in to his pharmacy.

## 2020-01-24 NOTE — Telephone Encounter (Signed)
Please advise 

## 2020-01-24 NOTE — Telephone Encounter (Signed)
Sent!

## 2020-01-24 NOTE — Telephone Encounter (Signed)
Patient request a Z pack or amoxicillin for a sinus infection. Had a negative covid test, so pretty sure it sinus.   [facility] [address]

## 2020-02-12 ENCOUNTER — Encounter (HOSPITAL_COMMUNITY): Payer: Self-pay | Admitting: Psychiatry

## 2020-02-12 ENCOUNTER — Other Ambulatory Visit: Payer: Self-pay

## 2020-02-12 ENCOUNTER — Telehealth (INDEPENDENT_AMBULATORY_CARE_PROVIDER_SITE_OTHER): Payer: BC Managed Care – PPO | Admitting: Psychiatry

## 2020-02-12 VITALS — Wt 206.0 lb

## 2020-02-12 DIAGNOSIS — F39 Unspecified mood [affective] disorder: Secondary | ICD-10-CM | POA: Diagnosis not present

## 2020-02-12 DIAGNOSIS — F411 Generalized anxiety disorder: Secondary | ICD-10-CM

## 2020-02-12 MED ORDER — FLUOXETINE HCL 40 MG PO CAPS: 40.0000 mg | ORAL_CAPSULE | Freq: Every day | ORAL | 0 refills | Status: DC

## 2020-02-12 MED ORDER — LAMOTRIGINE 25 MG PO TABS: 25.0000 mg | ORAL_TABLET | Freq: Every day | ORAL | 0 refills | Status: DC

## 2020-02-12 MED ORDER — LAMOTRIGINE 100 MG PO TABS: 100.0000 mg | ORAL_TABLET | Freq: Every day | ORAL | 0 refills | Status: DC
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
## 2020-02-19 ENCOUNTER — Other Ambulatory Visit (HOSPITAL_COMMUNITY): Payer: Self-pay | Admitting: Psychiatry

## 2020-02-19 DIAGNOSIS — F39 Unspecified mood [affective] disorder: Secondary | ICD-10-CM

## 2020-05-13 ENCOUNTER — Encounter (HOSPITAL_COMMUNITY): Payer: Self-pay | Admitting: Psychiatry

## 2020-05-13 ENCOUNTER — Telehealth (INDEPENDENT_AMBULATORY_CARE_PROVIDER_SITE_OTHER): Payer: BC Managed Care – PPO | Admitting: Psychiatry

## 2020-05-13 ENCOUNTER — Other Ambulatory Visit: Payer: Self-pay

## 2020-05-13 DIAGNOSIS — F39 Unspecified mood [affective] disorder: Secondary | ICD-10-CM | POA: Diagnosis not present

## 2020-05-13 DIAGNOSIS — F411 Generalized anxiety disorder: Secondary | ICD-10-CM | POA: Diagnosis not present

## 2020-05-13 MED ORDER — LAMOTRIGINE 100 MG PO TABS: 100.0000 mg | ORAL_TABLET | Freq: Every day | ORAL | 0 refills | Status: DC

## 2020-05-13 MED ORDER — FLUOXETINE HCL 40 MG PO CAPS: 40.0000 mg | ORAL_CAPSULE | Freq: Every day | ORAL | 0 refills | Status: DC
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
## 2020-05-23 ENCOUNTER — Other Ambulatory Visit (HOSPITAL_COMMUNITY): Payer: Self-pay | Admitting: Psychiatry

## 2020-05-23 DIAGNOSIS — F39 Unspecified mood [affective] disorder: Secondary | ICD-10-CM

## 2020-05-26 ENCOUNTER — Ambulatory Visit: Payer: BC Managed Care – PPO | Admitting: Family Medicine

## 2020-05-26 ENCOUNTER — Other Ambulatory Visit: Payer: Self-pay

## 2020-05-26 ENCOUNTER — Ambulatory Visit: Payer: Self-pay

## 2020-05-26 DIAGNOSIS — M79672 Pain in left foot: Secondary | ICD-10-CM

## 2020-05-26 MED ORDER — DOXYCYCLINE HYCLATE 100 MG PO CAPS: 100.0000 mg | ORAL_CAPSULE | Freq: Two times a day (BID) | ORAL | 0 refills | Status: DC

## 2020-05-26 NOTE — Progress Notes (Signed)
I saw and examined the patient with Dr. Marga Hoots and agree with assessment and plan as outlined.    Possible foreign body left heel.  Hyperechoic area in fat pad on ultrasound.   Attempted removal, did extensive irrigation.  Will start doxy.  If not better in a few days, will have one of our surgeons do wider incision and exploration.
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
## 2020-06-01 ENCOUNTER — Encounter: Payer: Self-pay | Admitting: Family Medicine

## 2020-08-03 ENCOUNTER — Other Ambulatory Visit (HOSPITAL_COMMUNITY): Payer: Self-pay | Admitting: *Deleted

## 2020-08-03 ENCOUNTER — Encounter: Payer: Self-pay | Admitting: Family Medicine

## 2020-08-03 ENCOUNTER — Other Ambulatory Visit (HOSPITAL_COMMUNITY): Payer: Self-pay | Admitting: Psychiatry

## 2020-08-03 DIAGNOSIS — F411 Generalized anxiety disorder: Secondary | ICD-10-CM

## 2020-08-03 DIAGNOSIS — F39 Unspecified mood [affective] disorder: Secondary | ICD-10-CM

## 2020-08-03 MED ORDER — FLUOXETINE HCL 40 MG PO CAPS: 40.0000 mg | ORAL_CAPSULE | Freq: Every day | ORAL | 0 refills | Status: DC

## 2020-08-18 ENCOUNTER — Telehealth (INDEPENDENT_AMBULATORY_CARE_PROVIDER_SITE_OTHER): Payer: BC Managed Care – PPO | Admitting: Psychiatry

## 2020-08-18 ENCOUNTER — Other Ambulatory Visit: Payer: Self-pay

## 2020-08-18 ENCOUNTER — Encounter (HOSPITAL_COMMUNITY): Payer: Self-pay | Admitting: Psychiatry

## 2020-08-18 ENCOUNTER — Telehealth (HOSPITAL_COMMUNITY): Payer: Self-pay | Admitting: *Deleted

## 2020-08-18 DIAGNOSIS — F411 Generalized anxiety disorder: Secondary | ICD-10-CM

## 2020-08-18 DIAGNOSIS — F39 Unspecified mood [affective] disorder: Secondary | ICD-10-CM | POA: Diagnosis not present

## 2020-08-18 MED ORDER — LAMOTRIGINE 100 MG PO TABS
100.0000 mg | ORAL_TABLET | Freq: Every day | ORAL | 0 refills | Status: DC
Start: 2020-08-18 — End: 2020-11-18

## 2020-08-18 MED ORDER — FLUOXETINE HCL 40 MG PO CAPS: 40.0000 mg | ORAL_CAPSULE | Freq: Every day | ORAL | 0 refills | Status: DC
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
## 2020-08-18 NOTE — Telephone Encounter (Signed)
Referral for sleep study to r/o, r/I narcolepsy faxed to [facility]. Pt aware.

## 2020-10-15 ENCOUNTER — Other Ambulatory Visit (HOSPITAL_COMMUNITY): Payer: Self-pay | Admitting: Psychiatry

## 2020-10-15 DIAGNOSIS — F39 Unspecified mood [affective] disorder: Secondary | ICD-10-CM

## 2020-11-04 ENCOUNTER — Institutional Professional Consult (permissible substitution): Payer: BC Managed Care – PPO | Admitting: Neurology

## 2020-11-18 ENCOUNTER — Telehealth (HOSPITAL_BASED_OUTPATIENT_CLINIC_OR_DEPARTMENT_OTHER): Payer: BC Managed Care – PPO | Admitting: Psychiatry

## 2020-11-18 ENCOUNTER — Encounter (HOSPITAL_COMMUNITY): Payer: Self-pay | Admitting: Psychiatry

## 2020-11-18 ENCOUNTER — Other Ambulatory Visit: Payer: Self-pay

## 2020-11-18 DIAGNOSIS — F411 Generalized anxiety disorder: Secondary | ICD-10-CM | POA: Diagnosis not present

## 2020-11-18 DIAGNOSIS — F39 Unspecified mood [affective] disorder: Secondary | ICD-10-CM

## 2020-11-18 MED ORDER — FLUOXETINE HCL 40 MG PO CAPS: 40.0000 mg | ORAL_CAPSULE | Freq: Every day | ORAL | 0 refills | Status: DC

## 2020-11-18 MED ORDER — LAMOTRIGINE 100 MG PO TABS: 100.0000 mg | ORAL_TABLET | Freq: Every day | ORAL | 0 refills | Status: DC
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
## 2020-12-25 ENCOUNTER — Ambulatory Visit: Payer: BC Managed Care – PPO | Admitting: Family

## 2020-12-25 ENCOUNTER — Ambulatory Visit: Payer: Self-pay

## 2020-12-25 ENCOUNTER — Other Ambulatory Visit: Payer: Self-pay

## 2020-12-25 DIAGNOSIS — M25561 Pain in right knee: Secondary | ICD-10-CM | POA: Diagnosis not present

## 2020-12-25 DIAGNOSIS — M76899 Other specified enthesopathies of unspecified lower limb, excluding foot: Secondary | ICD-10-CM | POA: Diagnosis not present

## 2020-12-25 MED ORDER — MELOXICAM 7.5 MG PO TABS: 7.5000 mg | ORAL_TABLET | Freq: Every day | ORAL | 0 refills | Status: DC

## 2020-12-25 MED ORDER — DICLOFENAC SODIUM 1 % EX GEL: 2.0000 g | Freq: Four times a day (QID) | CUTANEOUS | 0 refills | Status: DC | PRN

## 2020-12-25 NOTE — Progress Notes (Signed)
Office Visit Note   Patient: Henry Collins           Date of Birth: 1980/05/24           MRN: [id number] Visit Date: 12/25/2020              Requested by: Lavada Mesi, MD [address], [address] [place],  [place] [address] PCP: Lavada Mesi, MD  Chief Complaint  Patient presents with   Right Knee - Pain      HPI: The patient is a 40 year old gentleman seen today for evaluation of right knee pain this is been ongoing for about 4 days.  He had been out shopping and felt he might of overdone it, walk too much he has had worsening knee pain since the day of his shopping he reports some associated swelling and tenderness on top of his kneecap.  Pain with bending squatting stairs.  Denies any mechanical symptoms no instability  Assessment & Plan: Visit Diagnoses:  1. Acute pain of right knee     Plan: Point tender over the quadriceps tendon.  Discussed using anti-inflammatories by mouth for the next 2 weeks, rest. Limit squatting.  Follow-Up Instructions: No follow-ups on file.   Right Knee Exam   Muscle Strength  The patient has normal right knee strength.  Tenderness  The patient is experiencing tenderness in the patellar tendon.  Range of Motion  The patient has normal right knee ROM.  Other  Erythema: absent Swelling: none     Patient is alert, oriented, no adenopathy, well-dressed, normal affect, normal respiratory effort.   Imaging: No results found. No images are attached to the encounter.  Labs: Lab Results  Component Value Date   HGBA1C 5.5 02/09/2017     Lab Results  Component Value Date   ALBUMIN 4.5 04/03/2017   ALBUMIN 4.6 02/09/2017   ALBUMIN 4.7 06/30/2012    No results found for: MG Lab Results  Component Value Date   VD25OH 32.5 04/03/2017   VD25OH 24.8 (L) 02/09/2017    No results found for: PREALBUMIN CBC EXTENDED Latest Ref Rng & Units 11/21/2018 02/09/2017 06/30/2012  WBC 3.4 - 10.8 x10E3/uL - 5.7 5.5  RBC 4.14 - 5.80  x10E6/uL - 5.12 5.28  HGB 13.0 - 17.0 g/dL 76.7 34.1 93.7  HCT 90.2 - 51.0 % - 43.7 46.7  PLT 150 - 379 x10E3/uL - 264 -  NEUTROABS 1.4 - 7.0 x10E3/uL - 3.1 -  LYMPHSABS 0.7 - 3.1 x10E3/uL - 2.1 -     There is no height or weight on file to calculate BMI.  Orders:  Orders Placed This Encounter  Procedures   XR Knee 1-2 Views Right   Meds ordered this encounter  Medications   meloxicam (MOBIC) 7.5 MG tablet    Sig: Take 1 tablet (7.5 mg total) by mouth daily.    Dispense:  30 tablet    Refill:  0   diclofenac Sodium (VOLTAREN) 1 % GEL    Sig: Apply 2 g topically 4 (four) times daily as needed.    Dispense:  50 g    Refill:  0     Procedures: No procedures performed  Clinical Data: No additional findings.  ROS:  All other systems negative, except as noted in the HPI. Review of Systems  Constitutional:  Negative for chills and fever.  Musculoskeletal:  Positive for arthralgias, joint swelling and myalgias.   Objective: Vital Signs: There were no vitals taken for this visit.  Specialty  Comments:  No specialty comments available.  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   Seasonal allergic rhinitis 04/03/2017   Recurrent sinusitis 03/30/2017   Need for Tdap vaccination 02/20/2017   Elevated LDL cholesterol level 02/20/2017   Low HDL (under 40) 02/20/2017   Healthcare maintenance 01/12/2017   Blurred vision 01/12/2017   Anxiety 01/12/2017   HEMORRHOIDS, INTERNAL 09/24/2009   GERD 09/24/2009   RECTAL BLEEDING 09/24/2009   Past Medical History:  Diagnosis Date   Allergy    Anxiety    Arthritis    GERD (gastroesophageal reflux disease)    High cholesterol     Family History  Problem Relation Age of Onset   Depression Mother    Anxiety disorder Mother    Alcohol abuse  Mother    Breast cancer Mother    Diabetes Maternal Grandfather    Breast cancer Maternal Grandmother    Hypertension Paternal Grandfather    Hypertension Father     Past Surgical History:  Procedure Laterality Date   ARTERIAL THROMBECTOMY Right    Accident at work   ORIF TOE FRACTURE Right 11/21/2018   Procedure: INTERNAL FIXATION RIGHT 2ND TOE;  Surgeon: Nadara Mustard, MD;  Location: [facility] OR;  Service: Orthopedics;  Laterality: Right;   WISDOM TOOTH EXTRACTION     WRIST SURGERY Right    Social History   Occupational History   Occupation: Maintance  Tobacco Use   Smoking status: Former    Packs/day: 1.00    Years: 13.00    Pack years: 13.00    Types: Cigarettes    Quit date: 06/30/2008    Years since quitting: 12.4   Smokeless tobacco: Never  Vaping Use   Vaping Use: Never used  Substance and Sexual Activity   Alcohol use: No   Drug use: No   Sexual activity: Not Currently    Birth control/protection: None

## 2020-12-28 ENCOUNTER — Ambulatory Visit: Payer: BC Managed Care – PPO | Admitting: Physician Assistant

## 2021-01-21 ENCOUNTER — Other Ambulatory Visit: Payer: Self-pay | Admitting: Family

## 2021-02-17 ENCOUNTER — Telehealth (HOSPITAL_COMMUNITY): Payer: Self-pay | Admitting: *Deleted

## 2021-02-17 ENCOUNTER — Other Ambulatory Visit: Payer: Self-pay

## 2021-02-17 ENCOUNTER — Encounter (HOSPITAL_COMMUNITY): Payer: Self-pay | Admitting: Psychiatry

## 2021-02-17 ENCOUNTER — Telehealth (HOSPITAL_BASED_OUTPATIENT_CLINIC_OR_DEPARTMENT_OTHER): Payer: BC Managed Care – PPO | Admitting: Psychiatry

## 2021-02-17 VITALS — Wt 210.0 lb

## 2021-02-17 DIAGNOSIS — F39 Unspecified mood [affective] disorder: Secondary | ICD-10-CM | POA: Diagnosis not present

## 2021-02-17 DIAGNOSIS — F411 Generalized anxiety disorder: Secondary | ICD-10-CM | POA: Diagnosis not present

## 2021-02-17 MED ORDER — LAMOTRIGINE 25 MG PO TABS: 25.0000 mg | ORAL_TABLET | Freq: Every day | ORAL | 0 refills | Status: AC

## 2021-02-17 MED ORDER — FLUOXETINE HCL 40 MG PO CAPS: 40.0000 mg | ORAL_CAPSULE | Freq: Every day | ORAL | 0 refills | Status: DC

## 2021-02-17 MED ORDER — LAMOTRIGINE 100 MG PO TABS: 100.0000 mg | ORAL_TABLET | Freq: Every day | ORAL | 0 refills | Status: DC
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
## 2021-02-23 ENCOUNTER — Telehealth: Payer: Self-pay

## 2021-02-23 NOTE — Telephone Encounter (Signed)
Received fax that patient was previously scheduled for an appt in October for a sleep consult and cancelled but patient was now ready to schedule.   Tried calling pt and did not get an answer. LVM and will schedule pt when he calls back to schedule.

## 2021-02-24 NOTE — Telephone Encounter (Signed)
Opened in error

## 2021-05-19 ENCOUNTER — Encounter (HOSPITAL_COMMUNITY): Payer: Self-pay | Admitting: Psychiatry

## 2021-05-19 ENCOUNTER — Telehealth (HOSPITAL_BASED_OUTPATIENT_CLINIC_OR_DEPARTMENT_OTHER): Payer: BC Managed Care – PPO | Admitting: Psychiatry

## 2021-05-19 ENCOUNTER — Other Ambulatory Visit (HOSPITAL_COMMUNITY): Payer: Self-pay | Admitting: Psychiatry

## 2021-05-19 DIAGNOSIS — F39 Unspecified mood [affective] disorder: Secondary | ICD-10-CM | POA: Diagnosis not present

## 2021-05-19 DIAGNOSIS — F411 Generalized anxiety disorder: Secondary | ICD-10-CM

## 2021-05-19 MED ORDER — LAMOTRIGINE 100 MG PO TABS: 100.0000 mg | ORAL_TABLET | Freq: Every day | ORAL | 0 refills | Status: DC

## 2021-05-19 MED ORDER — FLUOXETINE HCL 40 MG PO CAPS: 40.0000 mg | ORAL_CAPSULE | Freq: Every day | ORAL | 0 refills | Status: DC
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
## 2021-06-02 ENCOUNTER — Emergency Department (HOSPITAL_BASED_OUTPATIENT_CLINIC_OR_DEPARTMENT_OTHER)
Admission: EM | Admit: 2021-06-02 | Discharge: 2021-06-02 | Disposition: A | Discharge status: Home / Self Care | Payer: BC Managed Care – PPO | Attending: Emergency Medicine | Admitting: Emergency Medicine

## 2021-06-02 ENCOUNTER — Encounter (HOSPITAL_BASED_OUTPATIENT_CLINIC_OR_DEPARTMENT_OTHER): Payer: Self-pay | Admitting: Emergency Medicine

## 2021-06-02 ENCOUNTER — Emergency Department (HOSPITAL_BASED_OUTPATIENT_CLINIC_OR_DEPARTMENT_OTHER): Payer: BC Managed Care – PPO

## 2021-06-02 ENCOUNTER — Other Ambulatory Visit: Payer: Self-pay

## 2021-06-02 DIAGNOSIS — K529 Noninfective gastroenteritis and colitis, unspecified: Secondary | ICD-10-CM | POA: Insufficient documentation

## 2021-06-02 DIAGNOSIS — R109 Unspecified abdominal pain: Secondary | ICD-10-CM | POA: Diagnosis present

## 2021-06-02 DIAGNOSIS — R1084 Generalized abdominal pain: Secondary | ICD-10-CM

## 2021-06-02 LAB — CBC
HCT: 42.5 % (ref 39.0–52.0)
Hemoglobin: 14.9 g/dL (ref 13.0–17.0)
MCH: 29.5 pg (ref 26.0–34.0)
MCHC: 35.1 g/dL (ref 30.0–36.0)
MCV: 84.2 fL (ref 80.0–100.0)
Platelets: 242 10*3/uL (ref 150–400)
RBC: 5.05 MIL/uL (ref 4.22–5.81)
RDW: 12.6 % (ref 11.5–15.5)
WBC: 5.5 10*3/uL (ref 4.0–10.5)
nRBC: 0 % (ref 0.0–0.2)

## 2021-06-02 LAB — COMPREHENSIVE METABOLIC PANEL
ALT: 107 U/L — ABNORMAL HIGH (ref 0–44)
AST: 65 U/L — ABNORMAL HIGH (ref 15–41)
Albumin: 3.8 g/dL (ref 3.5–5.0)
Alkaline Phosphatase: 65 U/L (ref 38–126)
Anion gap: 5 (ref 5–15)
BUN: 11 mg/dL (ref 6–20)
CO2: 26 mmol/L (ref 22–32)
Calcium: 8.7 mg/dL — ABNORMAL LOW (ref 8.9–10.3)
Chloride: 104 mmol/L (ref 98–111)
Creatinine, Ser: 1.11 mg/dL (ref 0.61–1.24)
GFR, Estimated: >60 mL/min (ref >60)
Glucose, Bld: 100 mg/dL — ABNORMAL HIGH (ref 70–99)
Potassium: 3.7 mmol/L (ref 3.5–5.1)
Sodium: 135 mmol/L (ref 135–145)
Total Bilirubin: 0.7 mg/dL (ref 0.3–1.2)
Total Protein: 7 g/dL (ref 6.5–8.1)

## 2021-06-02 LAB — URINALYSIS, ROUTINE W REFLEX MICROSCOPIC
Bilirubin Urine: NEGATIVE
Glucose, UA: NEGATIVE mg/dL
Ketones, ur: NEGATIVE mg/dL
Leukocytes,Ua: NEGATIVE
Nitrite: NEGATIVE
Protein, ur: 30 mg/dL — AB
Specific Gravity, Urine: >=1.03 (ref 1.005–1.030)
pH: 5.5 (ref 5.0–8.0)

## 2021-06-02 LAB — URINALYSIS, MICROSCOPIC (REFLEX)

## 2021-06-02 LAB — LIPASE, BLOOD: Lipase: 26 U/L (ref 11–51)

## 2021-06-02 MED ORDER — DICYCLOMINE HCL 20 MG PO TABS: 20.0000 mg | ORAL_TABLET | Freq: Two times a day (BID) | ORAL | 0 refills | Status: AC

## 2021-06-02 MED ORDER — ONDANSETRON HCL 4 MG/2ML IJ SOLN
4.0000 mg | Freq: Once | INTRAMUSCULAR | Status: AC
  Administered 2021-06-02: 4 mg via INTRAVENOUS
  Filled 2021-06-02: qty 2

## 2021-06-02 MED ORDER — IOHEXOL 300 MG/ML  SOLN
100.0000 mL | Freq: Once | INTRAMUSCULAR | Status: AC | PRN
  Administered 2021-06-02: 100 mL via INTRAVENOUS

## 2021-06-02 MED ORDER — MORPHINE SULFATE (PF) 4 MG/ML IV SOLN
4.0000 mg | Freq: Once | INTRAVENOUS | Status: AC
  Administered 2021-06-02: 4 mg via INTRAVENOUS
  Filled 2021-06-02: qty 1

## 2021-06-02 NOTE — ED Triage Notes (Signed)
Pt reports generalized middle abd pain since Saturday; +diarrhea, not eating d/t pain

## 2021-06-02 NOTE — ED Provider Notes (Signed)
[facility] Provider Note   CSN: [id number] Arrival date & time: 06/02/21  1131     History  Chief Complaint  Patient presents with   Abdominal Pain    Henry Collins is a 41 y.o. male.  41 year old male presents today for evaluation of 4-day duration of decreased p.o. intake, abdominal pain, diarrhea.  He states this started early Sunday morning where he was woken up from his sleep due to his symptoms.  He had [place] food the night prior.  Denies emesis however with mild p.o. intake he gets severe abdominal pain and states has not had any solid p.o. intake.  Does state mild hydration with water and Gatorade however if he has significant amounts of fluids to drink he reports significant abdominal cramping with this.  Denies fever, but does endorse multiple episodes of chills and night sweats.  Denies any blood in his stools.  States he was evaluated at urgent care Monday and provided Zofran without much of any symptom relief.  He has been taking Tylenol and Motrin every 3 hours and alternating schedule without improvement of symptoms.  Denies prior abdominal surgeries.  Without significant past medical history.  The history is provided by the patient. No language interpreter was used.      Home Medications Prior to Admission medications   Medication Sig Start Date End Date Taking? Authorizing Provider  diclofenac Sodium (VOLTAREN) 1 % GEL APPLY 2 GRAMS TOPICALLY 4 TIMES A DAY AS NEEDED 01/21/21   Suzan Slick, NP  FLUoxetine (PROZAC) 40 MG capsule Take 1 capsule (40 mg total) by mouth daily. 05/19/21 05/19/22  Arfeen, Arlyce Harman, MD  ibuprofen (ADVIL) 200 MG tablet Take 200 mg by mouth every 6 (six) hours as needed.    [provider]  lamoTRIgine (LAMICTAL) 100 MG tablet Take 1 tablet (100 mg total) by mouth daily. 05/19/21   Arfeen, Arlyce Harman, MD  lamoTRIgine (LAMICTAL) 25 MG tablet Take 1 tablet (25 mg total) by mouth daily. 02/17/21 02/17/22  Arfeen, Arlyce Harman, MD  meloxicam (MOBIC) 7.5 MG tablet TAKE 1 TABLET BY MOUTH EVERY DAY 01/21/21   Suzan Slick, NP      Allergies    Patient has no known allergies.    Review of Systems   Review of Systems  Constitutional:  Negative for chills and fever.  Cardiovascular:  Negative for chest pain.  Gastrointestinal:  Positive for abdominal pain, diarrhea and nausea. Negative for blood in stool and vomiting.  Genitourinary:  Negative for dysuria.  Neurological:  Negative for light-headedness.  All other systems reviewed and are negative.  Physical Exam Updated Vital Signs BP 129/80   Pulse 71   Temp 98.4 F (36.9 C) (Oral)   Resp 17   Ht 6\' 1"  (1.854 m)   Wt 94.3 kg   SpO2 100%   BMI 27.44 kg/m  Physical Exam Vitals and nursing note reviewed.  Constitutional:      General: He is not in acute distress.    Appearance: Normal appearance. He is not ill-appearing.  HENT:     Head: Normocephalic and atraumatic.     Nose: Nose normal.  Eyes:     General: No scleral icterus.    Extraocular Movements: Extraocular movements intact.     Conjunctiva/sclera: Conjunctivae normal.  Cardiovascular:     Rate and Rhythm: Normal rate and regular rhythm.     Pulses: Normal pulses.     Heart sounds: Normal heart sounds.  Pulmonary:     Effort: Pulmonary effort is normal. No respiratory distress.     Breath sounds: Normal breath sounds. No wheezing or rales.  Abdominal:     General: There is no distension.     Palpations: Abdomen is soft.     Tenderness: There is abdominal tenderness (Diffuse abdominal tenderness, worse in the epigastric region). There is no right CVA tenderness, left CVA tenderness or guarding.  Musculoskeletal:        General: Normal range of motion.     Cervical back: Normal range of motion.  Skin:    General: Skin is warm and dry.  Neurological:     General: No focal deficit present.     Mental Status: He is alert. Mental status is at baseline.    ED Results / Procedures /  Treatments   Labs (all labs ordered are listed, but only abnormal results are displayed) Labs Reviewed  COMPREHENSIVE METABOLIC PANEL - Abnormal; Notable for the following components:      Result Value   Glucose, Bld 100 (*)    Calcium 8.7 (*)    AST 65 (*)    ALT 107 (*)    All other components within normal limits  URINALYSIS, ROUTINE W REFLEX MICROSCOPIC - Abnormal; Notable for the following components:   Color, Urine AMBER (*)    Hgb urine dipstick TRACE (*)    Protein, ur 30 (*)    All other components within normal limits  URINALYSIS, MICROSCOPIC (REFLEX) - Abnormal; Notable for the following components:   Bacteria, UA RARE (*)    All other components within normal limits  LIPASE, BLOOD  CBC    EKG None  Radiology No results found.  Procedures Procedures    Medications Ordered in ED Medications  iohexol (OMNIPAQUE) 300 MG/ML solution 100 mL (has no administration in time range)  morphine (PF) 4 MG/ML injection 4 mg (4 mg Intravenous Given 06/02/21 1321)  ondansetron (ZOFRAN) injection 4 mg (4 mg Intravenous Given 06/02/21 1321)    ED Course/ Medical Decision Making/ A&P                           Medical Decision Making Amount and/or Complexity of Data Reviewed Labs: ordered. Radiology: ordered.  Risk Prescription drug management.   Medical Decision Making / ED Course   This patient presents to the ED for concern of abdominal pain, diarrhea, this involves an extensive number of treatment options, and is a complaint that carries with it a high risk of complications and morbidity.  The differential diagnosis includes viral gastroenteritis, food poisoning, appendicitis, pancreatitis, cholecystitis  MDM: 41 year old male presents today for evaluation of 4-day duration of above symptoms.  Patient given IV fluids, morphine, Zofran.  He appears well.  Reports improvement with the above regimen.  CBC without leukocytosis or anemia.  UA without evidence of UTI.   CMP with mild transaminitis otherwise without acute findings.  Renal function preserved.  CT abdomen pelvis with contrast without acute intra-abdominal findings.  Patient able to tolerate p.o. intake in the emergency room without difficulty.  Patient has Zofran available at home.  Will provide Bentyl.  Symptomatic treatment discussed.  Return precautions discussed.  Patient does not have PCP.  We will provide referral to [facility] health community health and wellness clinic.  Patient voices understanding and is in agreement with plan.  Lab Tests: -I ordered, reviewed, and interpreted labs.   The pertinent results include:  Labs Reviewed  COMPREHENSIVE METABOLIC PANEL - Abnormal; Notable for the following components:      Result Value   Glucose, Bld 100 (*)    Calcium 8.7 (*)    AST 65 (*)    ALT 107 (*)    All other components within normal limits  URINALYSIS, ROUTINE W REFLEX MICROSCOPIC - Abnormal; Notable for the following components:   Color, Urine AMBER (*)    Hgb urine dipstick TRACE (*)    Protein, ur 30 (*)    All other components within normal limits  URINALYSIS, MICROSCOPIC (REFLEX) - Abnormal; Notable for the following components:   Bacteria, UA RARE (*)    All other components within normal limits  LIPASE, BLOOD  CBC      EKG  EKG Interpretation  Date/Time:    Ventricular Rate:    PR Interval:    QRS Duration:   QT Interval:    QTC Calculation:   R Axis:     Text Interpretation:           Imaging Studies ordered: I ordered imaging studies including CT abdomen pelvis with contrast I independently visualized and interpreted imaging. I agree with the radiologist interpretation   Medicines ordered and prescription drug management: Meds ordered this encounter  Medications   morphine (PF) 4 MG/ML injection 4 mg   ondansetron (ZOFRAN) injection 4 mg   iohexol (OMNIPAQUE) 300 MG/ML solution 100 mL    -I have reviewed the patients home medicines and have made  adjustments as needed  Social Determinants of Health:  Factors impacting patients care include: Does not have PCP.  Referral to [facility] health community health and wellness clinic provided.   Reevaluation: After the interventions noted above, I reevaluated the patient and found that they have :improved  Co morbidities that complicate the patient evaluation  Past Medical History:  Diagnosis Date   Allergy    Anxiety    Arthritis    GERD (gastroesophageal reflux disease)    High cholesterol       Dispostion: Patient is appropriate for discharge.  Discharged in stable condition.  Return precautions discussed.  Final Clinical Impression(s) / ED Diagnoses Final diagnoses:  Generalized abdominal pain  Gastroenteritis    Rx / DC Orders ED Discharge Orders          Ordered    dicyclomine (BENTYL) 20 MG tablet  2 times daily        06/02/21 1452              Evlyn Courier, PA-C 06/02/21 1452    Margette Fast, MD 06/06/21 1311

## 2021-06-02 NOTE — ED Notes (Signed)
Blood draw attempted 2X, waiting for room and IV

## 2021-06-02 NOTE — ED Notes (Signed)
Pt transported to CT ?

## 2021-06-02 NOTE — Discharge Instructions (Signed)
Your work-up today was reassuring.  You had some improvement in your symptoms following fluids, and IV medications.  Your blood work today was reassuring.  CT scan did not show any concerning cause of your abdominal pain.  Continue taking Zofran as you need to.  Ensure that you are drinking plenty of fluids.  I have also sent Bentyl into the pharmacy for you to use as needed for abdominal pain.  Have also attached [facility] community [facility] wellness clinic information for you to establish care with.  If you have any worsening symptoms please return to the emergency room for evaluation.

## 2021-08-18 ENCOUNTER — Telehealth (HOSPITAL_BASED_OUTPATIENT_CLINIC_OR_DEPARTMENT_OTHER): Payer: BC Managed Care – PPO | Admitting: Psychiatry

## 2021-08-18 ENCOUNTER — Encounter (HOSPITAL_COMMUNITY): Payer: Self-pay | Admitting: Psychiatry

## 2021-08-18 ENCOUNTER — Other Ambulatory Visit (HOSPITAL_COMMUNITY): Payer: Self-pay | Admitting: *Deleted

## 2021-08-18 DIAGNOSIS — F39 Unspecified mood [affective] disorder: Secondary | ICD-10-CM

## 2021-08-18 DIAGNOSIS — F411 Generalized anxiety disorder: Secondary | ICD-10-CM

## 2021-08-18 MED ORDER — LAMOTRIGINE 100 MG PO TABS: 100.0000 mg | ORAL_TABLET | Freq: Every day | ORAL | 0 refills | Status: DC

## 2021-08-18 MED ORDER — FLUOXETINE HCL 40 MG PO CAPS: 40.0000 mg | ORAL_CAPSULE | Freq: Every day | ORAL | 0 refills | Status: DC
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
## 2021-11-16 ENCOUNTER — Other Ambulatory Visit: Payer: Self-pay

## 2021-11-16 ENCOUNTER — Encounter (HOSPITAL_BASED_OUTPATIENT_CLINIC_OR_DEPARTMENT_OTHER): Payer: Self-pay | Admitting: Emergency Medicine

## 2021-11-16 ENCOUNTER — Emergency Department (HOSPITAL_BASED_OUTPATIENT_CLINIC_OR_DEPARTMENT_OTHER)
Admission: EM | Admit: 2021-11-16 | Discharge: 2021-11-16 | Disposition: A | Discharge status: Home / Self Care | Payer: BC Managed Care – PPO | Attending: Emergency Medicine | Admitting: Emergency Medicine

## 2021-11-16 DIAGNOSIS — T24222A Burn of second degree of left knee, initial encounter: Secondary | ICD-10-CM | POA: Diagnosis not present

## 2021-11-16 DIAGNOSIS — T300 Burn of unspecified body region, unspecified degree: Secondary | ICD-10-CM

## 2021-11-16 DIAGNOSIS — T22012A Burn of unspecified degree of left forearm, initial encounter: Secondary | ICD-10-CM | POA: Diagnosis present

## 2021-11-16 DIAGNOSIS — X04XXXA Exposure to ignition of highly flammable material, initial encounter: Secondary | ICD-10-CM | POA: Diagnosis not present

## 2021-11-16 DIAGNOSIS — T22212A Burn of second degree of left forearm, initial encounter: Secondary | ICD-10-CM | POA: Insufficient documentation

## 2021-11-16 MED ORDER — BACITRACIN ZINC 500 UNIT/GM EX OINT
TOPICAL_OINTMENT | Freq: Two times a day (BID) | CUTANEOUS | Status: DC
  Filled 2021-11-16: qty 56.7

## 2021-11-16 MED ORDER — OXYCODONE-ACETAMINOPHEN 5-325 MG PO TABS
2.0000 | ORAL_TABLET | Freq: Once | ORAL | Status: AC
  Administered 2021-11-16: 2 via ORAL
  Filled 2021-11-16: qty 2

## 2021-11-16 MED ORDER — OXYCODONE HCL 5 MG PO TABS: 2.5000 mg | ORAL_TABLET | Freq: Four times a day (QID) | ORAL | 0 refills | Status: AC | PRN
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
## 2021-11-16 NOTE — Discharge Instructions (Addendum)
Contact a health care provider if: Your symptoms do not improve with treatment. Your pain is not relieved with medicine. You have more redness, swelling, or pain around your wound. You have fluid, blood, pus, or a bad smell coming from the wound. Your wound feels warm to the touch. You have a fever or chills. Get help right away if: You develop red streaks near the wound. You develop severe pain.
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
## 2021-11-16 NOTE — ED Notes (Signed)
Non adhesive dsg applied to left arm blister sites and to left knee blister site. Secured with Kerlix. Bacitracin to sites with cotton tip applicator. Dsg supplies provided to significant other and wound care teaching also provided. ED PA in to speak with pt as well in regards to pain control and observing for infection or when it would require him to return to the ED.
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
## 2021-11-18 ENCOUNTER — Encounter (HOSPITAL_COMMUNITY): Payer: Self-pay | Admitting: Psychiatry

## 2021-11-18 ENCOUNTER — Telehealth (HOSPITAL_BASED_OUTPATIENT_CLINIC_OR_DEPARTMENT_OTHER): Payer: BC Managed Care – PPO | Admitting: Psychiatry

## 2021-11-18 VITALS — Wt 208.0 lb

## 2021-11-18 DIAGNOSIS — F411 Generalized anxiety disorder: Secondary | ICD-10-CM

## 2021-11-18 DIAGNOSIS — F39 Unspecified mood [affective] disorder: Secondary | ICD-10-CM

## 2021-11-18 MED ORDER — FLUOXETINE HCL 40 MG PO CAPS: 40.0000 mg | ORAL_CAPSULE | Freq: Every day | ORAL | 0 refills | Status: DC

## 2021-11-18 MED ORDER — LAMOTRIGINE 100 MG PO TABS: 100.0000 mg | ORAL_TABLET | Freq: Every day | ORAL | 0 refills | Status: DC
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
## 2022-02-17 ENCOUNTER — Encounter (HOSPITAL_COMMUNITY): Payer: Self-pay | Admitting: Psychiatry

## 2022-02-17 ENCOUNTER — Telehealth (HOSPITAL_BASED_OUTPATIENT_CLINIC_OR_DEPARTMENT_OTHER): Payer: Self-pay | Admitting: Psychiatry

## 2022-02-17 DIAGNOSIS — F39 Unspecified mood [affective] disorder: Secondary | ICD-10-CM

## 2022-02-17 DIAGNOSIS — F411 Generalized anxiety disorder: Secondary | ICD-10-CM

## 2022-02-17 MED ORDER — LAMOTRIGINE 100 MG PO TABS: 100.0000 mg | ORAL_TABLET | Freq: Every day | ORAL | 0 refills | Status: AC

## 2022-02-17 MED ORDER — FLUOXETINE HCL 40 MG PO CAPS: 40.0000 mg | ORAL_CAPSULE | Freq: Every day | ORAL | 0 refills | Status: AC
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
## 2022-06-04 IMAGING — MR MR SHOULDER*L* W/O CM
5 series · 34 of 40 positions shown · non-contrast
Comparison: Radiographs 02/27/2019

CLINICAL DATA: Left shoulder pain.

EXAM:
MRI OF THE LEFT SHOULDER WITHOUT CONTRAST
TECHNIQUE: Multiplanar, multisequence MR imaging of the shoulder was performed.
No intravenous contrast was administered.

[Series 5: PD fat-sat · axial · 4.0mm · 0.55mm/px · z∈[-37,+69]mm · 8 of 23 slices shown (1 of 2)]
[im 1/23]
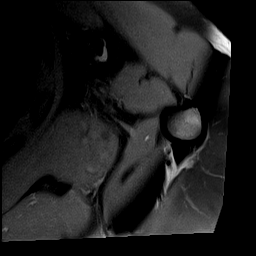
[im 4/23]
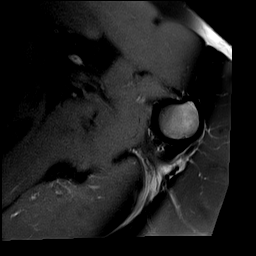
[im 7/23]
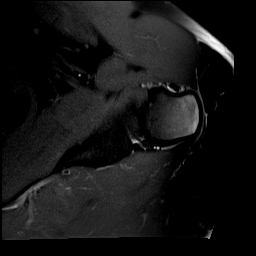
[im 10/23]
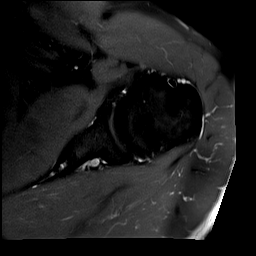
[im 13/23]
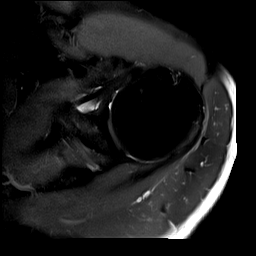
[im 16/23]
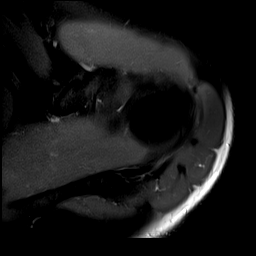
[im 19/23]
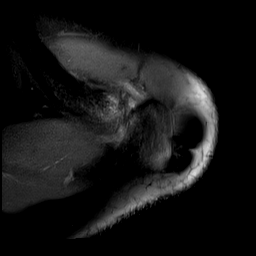
[im 23/23]
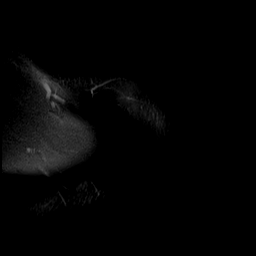

[Series 6: T1 · oblique · 4.0mm · 0.27mm/px · 6 of 26 slices shown]
[im 1/26]
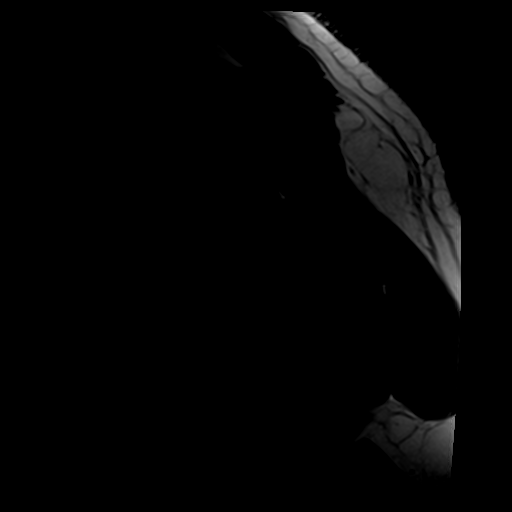
[im 3/26]
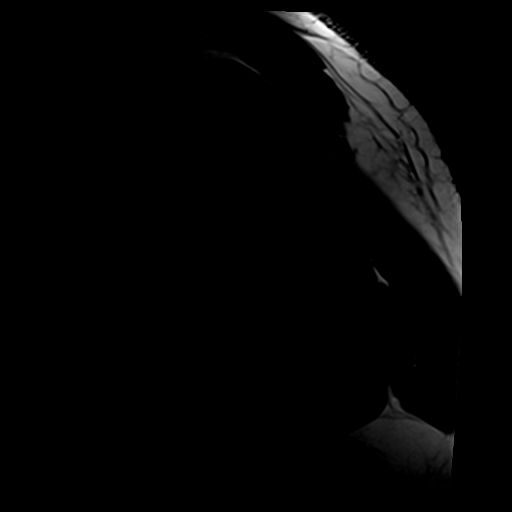
[im 9/26]
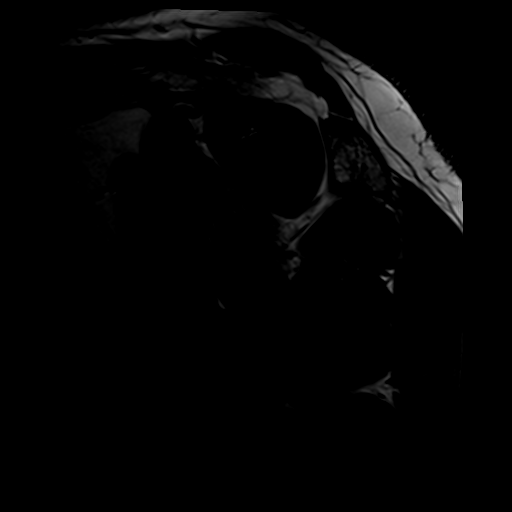
[im 12/26]
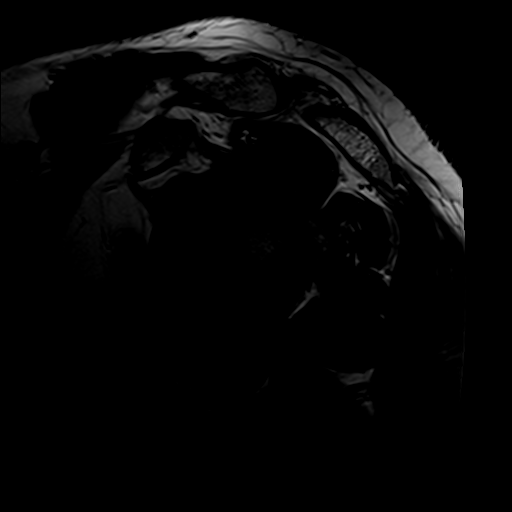
[im 14/26]
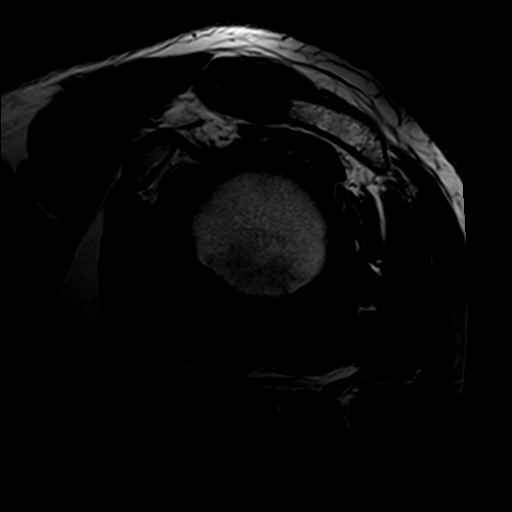
[im 17/26]
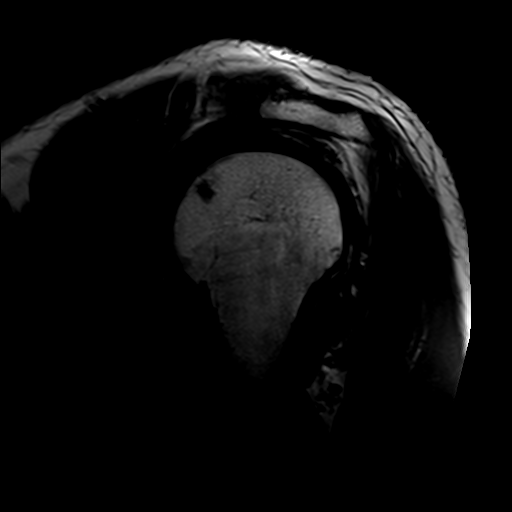

[Series 7: T2 fat-sat · oblique · 4.0mm · 0.55mm/px · 8 of 26 slices shown (1 of 2)]
[im 1/26]
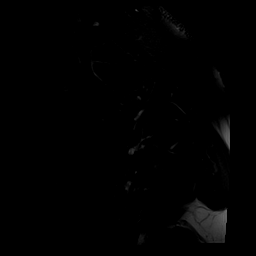
[im 3/26]
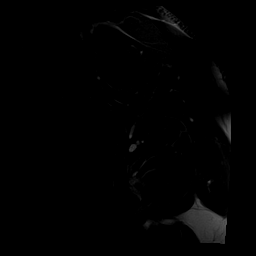
[im 9/26]
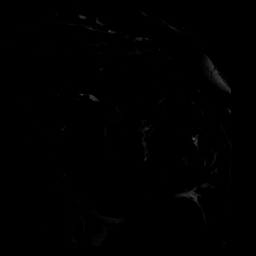
[im 12/26]
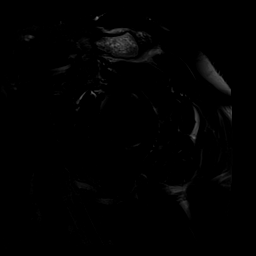
[im 14/26]
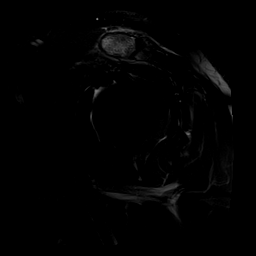
[im 17/26]
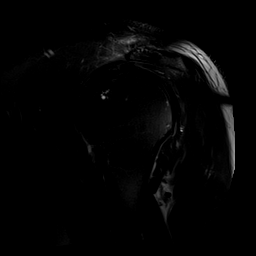
[im 23/26]
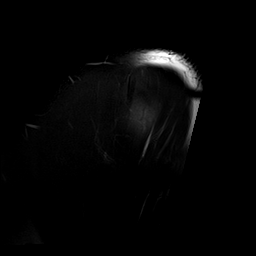
[im 26/26]
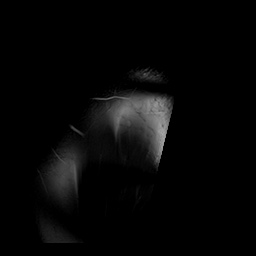

[Series 8: T2 fat-sat · oblique · 4.0mm · 0.55mm/px · 6 of 17 slices shown (2 of 2)]
[im 1/17]
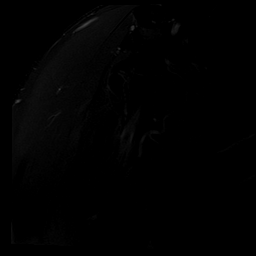
[im 4/17]
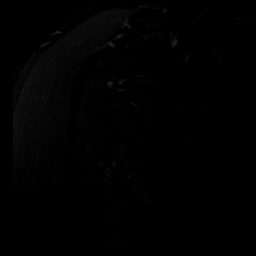
[im 7/17]
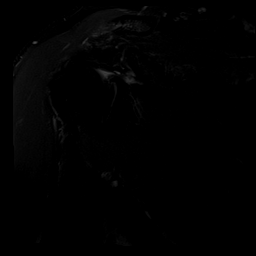
[im 10/17]
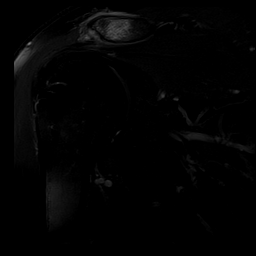
[im 13/17]
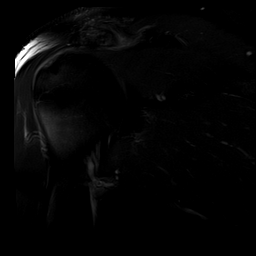
[im 17/17]
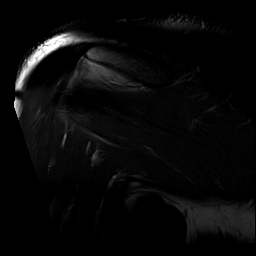

[Series 9: PD fat-sat · oblique · 4.0mm · 0.27mm/px · 6 of 17 slices shown (2 of 2)]
[im 1/17]
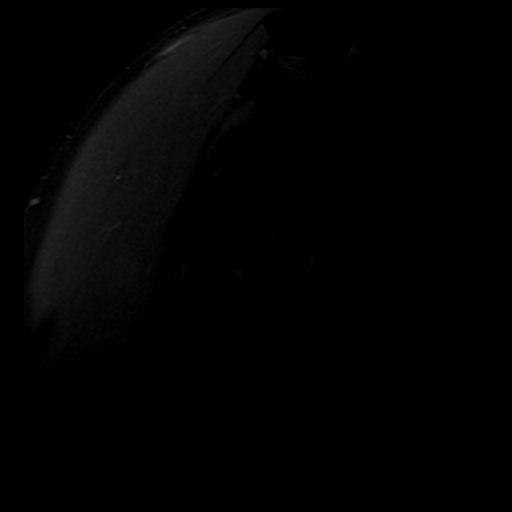
[im 4/17]
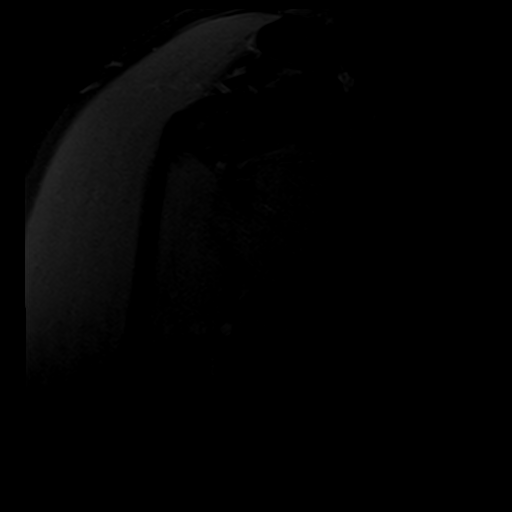
[im 7/17]
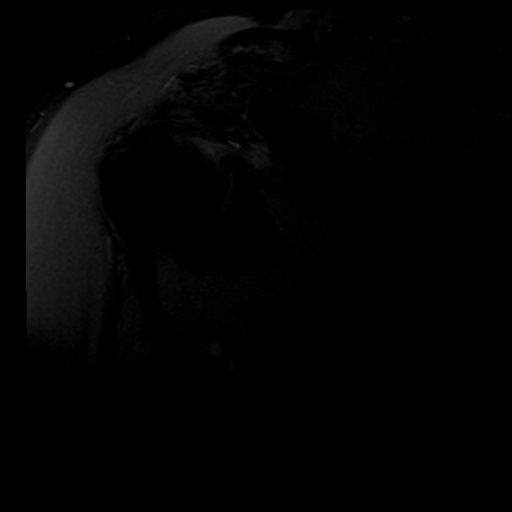
[im 10/17]
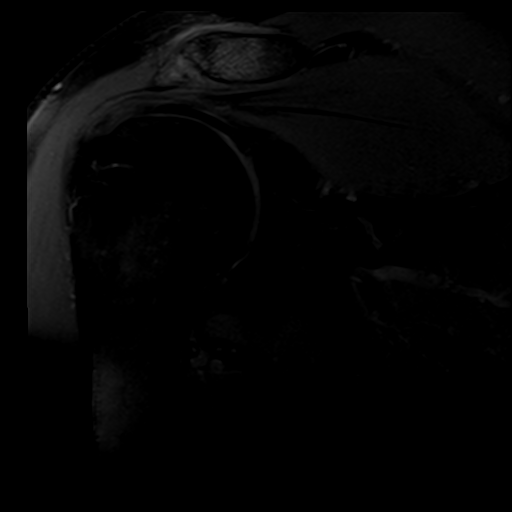
[im 13/17]
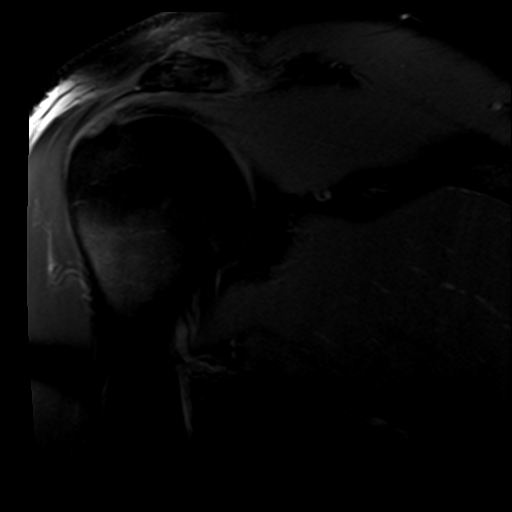
[im 17/17]
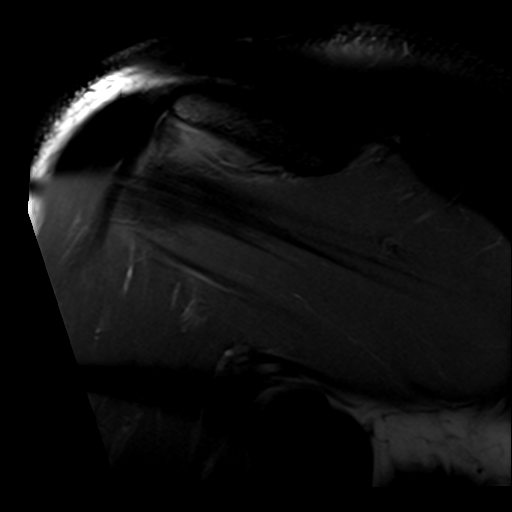

[34 of 40 positions shown; findings below may reference images not displayed]

FINDINGS: Rotator cuff: Minimal rotator cuff tendinopathy/tendinosis. No
partial or full-thickness rotator cuff tear.

Muscles:  Normal

Biceps long head:  Intact

Acromioclavicular Joint: No significant degenerative changes but
there is fairly marked edema like signal changes in the distal
clavicle. Findings suggests a stress related process and possible
acro-osteolysis. No findings for AC joint separation. Type 1-2
acromion. No significant lateral downsloping or undersurface
spurring.

Glenohumeral Joint: No significant degenerative changes. The
articular cartilage is intact. No cartilage defects or osteochondral
abnormality. But there is a small joint effusion and moderate
synovitis versus adhesive capsulitis.

Labrum:  No labral tears are identified.

Bones: Marked edema like signal changes in the distal clavicle as
detailed above. Probable stress related process.

Other: Mild subacromial/subdeltoid bursitis.
IMPRESSION: 1. Minimal rotator cuff tendinopathy/tendinosis. No partial or
full-thickness rotator cuff tear.
2. Intact long head biceps tendon and glenoid labrum.
3. Marked edema like signal changes in the distal clavicle. Findings
suggests a stress related process and possible acro-osteolysis. No
significant AC joint degenerative changes and unremarkable acromial
anatomy.
4. There is thickening of the capsular structures in the axillary
recess which can be seen with adhesive capsulitis or synovitis.
5. Mild subacromial/subdeltoid bursitis.

## 2022-08-19 ENCOUNTER — Other Ambulatory Visit (HOSPITAL_COMMUNITY): Payer: Self-pay | Admitting: Psychiatry

## 2022-08-19 DIAGNOSIS — F39 Unspecified mood [affective] disorder: Secondary | ICD-10-CM

## 2022-08-19 DIAGNOSIS — F411 Generalized anxiety disorder: Secondary | ICD-10-CM
